# Patient Record
Sex: Female | Born: 1975 | Race: White | Hispanic: Yes | Marital: Married | Smoking: Never smoker
Health system: Southern US, Community
[De-identification: ages and names within clinical notes are randomized; demographics above are authoritative.]

## PROBLEM LIST (undated history)

## (undated) DIAGNOSIS — Z87440 Personal history of urinary (tract) infections: Secondary | ICD-10-CM

## (undated) DIAGNOSIS — R87629 Unspecified abnormal cytological findings in specimens from vagina: Secondary | ICD-10-CM

## (undated) DIAGNOSIS — Z8759 Personal history of other complications of pregnancy, childbirth and the puerperium: Secondary | ICD-10-CM

## (undated) DIAGNOSIS — D649 Anemia, unspecified: Secondary | ICD-10-CM

## (undated) DIAGNOSIS — Z8742 Personal history of other diseases of the female genital tract: Secondary | ICD-10-CM

## (undated) HISTORY — PX: CERVICAL BIOPSY: SHX590

## (undated) HISTORY — PX: OTHER SURGICAL HISTORY: SHX169

## (undated) HISTORY — DX: Unspecified abnormal cytological findings in specimens from vagina: R87.629

## (undated) HISTORY — DX: Personal history of other complications of pregnancy, childbirth and the puerperium: Z87.59

## (undated) HISTORY — PX: DILATION AND CURETTAGE OF UTERUS: SHX78

## (undated) HISTORY — DX: Personal history of other diseases of the female genital tract: Z87.42

## (undated) HISTORY — DX: Personal history of urinary (tract) infections: Z87.440

## (undated) HISTORY — DX: Anemia, unspecified: D64.9

---

## 2018-11-09 ENCOUNTER — Other Ambulatory Visit: Payer: Self-pay

## 2018-11-14 ENCOUNTER — Ambulatory Visit: Payer: Self-pay | Attending: Oncology | Admitting: *Deleted

## 2018-11-14 ENCOUNTER — Encounter: Payer: Self-pay | Admitting: *Deleted

## 2018-11-14 ENCOUNTER — Other Ambulatory Visit: Payer: Self-pay

## 2018-11-14 VITALS — BP 133/91 | HR 83 | Temp 97.7°F | Ht 64.0 in | Wt 201.4 lb

## 2018-11-14 DIAGNOSIS — N644 Mastodynia: Secondary | ICD-10-CM

## 2018-11-14 DIAGNOSIS — Z0000 Encounter for general adult medical examination without abnormal findings: Secondary | ICD-10-CM

## 2018-11-14 NOTE — Progress Notes (Signed)
  Subjective:     Patient ID: Genevieve Norlander, female   DOB: Dec 14, 1975, 43 y.o.   MRN: [id number]  HPI   Review of Systems     Objective:   Physical Exam Chest:     Breasts:        Right: Tenderness present. No swelling, bleeding, inverted nipple, mass, nipple discharge or skin change.        Left: Tenderness present. No swelling, bleeding, inverted nipple, mass, nipple discharge or skin change.    Abdominal:     Palpations: There is no hepatomegaly or splenomegaly.  Genitourinary:    Exam position: Lithotomy position.     Labia:        Right: No rash, tenderness, lesion or injury.        Left: No rash, tenderness, lesion or injury.      Urethra: No prolapse, urethral swelling or urethral lesion.     Vagina: No signs of injury and foreign body. No vaginal discharge, erythema, tenderness, bleeding, lesions or prolapsed vaginal walls.     Cervix: Friability present. No cervical motion tenderness, discharge, lesion, erythema, cervical bleeding or eversion.     Uterus: Not deviated, not enlarged, not fixed, not tender and no uterine prolapse.      Adnexa:        Right: No mass.         Left: No mass.       Rectum: No mass.     Lymphadenopathy:     Upper Body:     Right upper body: No supraclavicular or axillary adenopathy.     Left upper body: No supraclavicular or axillary adenopathy.        Assessment:     43 year old [place] female self referral presents with complaints of targeted left breast pain.  Leola Brazil, the interpreter present during the interview and exam.  States she has bilateral breast pain all the time with greater pain prior to her cycle.  States it is better with a support bra.  States they are very tender to palpation.  Drinks 1 cup of coffee most days. Points to the 4-5:00 area of the left breast and states this is the area that hurts the most and all the time.  On clinical breast exam bilateral breast are tender to palpation.  I can palpate  approximately 2, 1-2 cm nodules at 4-5:00 left breast.  Taught self breast awareness.  Patient states she had abnormal paps and had a conization in [place] in January 2020.  Without any notes or results to review, I felt it would be prudent to do a 6 month follow-up pap.  Specimen collected for pap without difficulty.  Patient has been screened for eligibility.  She does not have any insurance, Medicare or Medicaid.  She also meets financial eligibility.  Hand-out given on the Affordable Care Act. Risk Assessment    Risk Scores      11/14/2018   Last edited by: Orson Slick, CMA   5-year risk: 0.3 %   Lifetime risk: 4.7 %            Plan:     Will order bilateral diagnostic mammogram and ultrasound for targeted breast pain.  Jeanella Anton to schedule the patient.  Specimen for pap sent to the lab.  Will follow-up per [facility] protocol.

## 2018-11-21 ENCOUNTER — Ambulatory Visit
Admission: RE | Admit: 2018-11-21 | Discharge: 2018-11-21 | Disposition: A | Discharge status: Home / Self Care | Payer: Self-pay | Source: Ambulatory Visit | Attending: Oncology | Admitting: Oncology

## 2018-11-21 ENCOUNTER — Encounter: Payer: Self-pay | Admitting: Radiology

## 2018-11-21 DIAGNOSIS — Z0000 Encounter for general adult medical examination without abnormal findings: Secondary | ICD-10-CM

## 2018-11-23 LAB — PAP LB AND HPV HIGH-RISK: HPV, high-risk: NEGATIVE

## 2018-11-26 ENCOUNTER — Other Ambulatory Visit: Payer: Self-pay | Admitting: *Deleted

## 2018-11-26 DIAGNOSIS — N6489 Other specified disorders of breast: Secondary | ICD-10-CM

## 2018-11-29 ENCOUNTER — Encounter: Payer: Self-pay | Admitting: *Deleted

## 2018-11-29 NOTE — Progress Notes (Signed)
Called patient today via the interpreter Jacqui.  Reviewed mammogram and pap results.  Discussed that  Since I could palpate 2 nodules at the site of her pain at 5:00, and no mention of that area on the mammogram report, I would like to refer her for a surgical consult.  She is agreeable.  I have scheduled her to see Dr. Bary Castilla on 12/11/18 @ 9:45.  Will scheduled 6 month follow up mammogram as radiology recommended after her appointment.  Next pap will be due in 5 years.

## 2018-12-11 ENCOUNTER — Ambulatory Visit: Payer: Self-pay | Admitting: General Surgery

## 2018-12-12 ENCOUNTER — Ambulatory Visit (INDEPENDENT_AMBULATORY_CARE_PROVIDER_SITE_OTHER): Payer: Self-pay | Admitting: General Surgery

## 2018-12-12 ENCOUNTER — Encounter: Payer: Self-pay | Admitting: General Surgery

## 2018-12-12 ENCOUNTER — Other Ambulatory Visit: Payer: Self-pay

## 2018-12-12 VITALS — BP 133/87 | HR 79 | Temp 97.7°F | Resp 14 | Ht 65.0 in | Wt 206.0 lb

## 2018-12-12 DIAGNOSIS — N6002 Solitary cyst of left breast: Secondary | ICD-10-CM

## 2018-12-12 DIAGNOSIS — N644 Mastodynia: Secondary | ICD-10-CM

## 2018-12-12 NOTE — Progress Notes (Signed)
Patient ID: Ellan LambertAraceli Gutierrez, female   DOB: 04/13/1976, 43 y.o.   MRN: [id number]  Chief Complaint  Patient presents with  . New Patient (Initial Visit)    breast mass    HPI [place] Sharen HonesGutierrez is a 43 y.o. female.   Today's interview was held with the assistance of a Spanish language interpreter.  She has been referred for evaluation of a breast mass.  She states that she has had cysts in her breasts in the past, diagnosed in [place].  She recently underwent a mammogram and breast ultrasound due to pain in her left breast with masses palpated by her primary care provider.  There were no concerning findings on either imaging study.  Ms. Sharen HonesGutierrez states that she has noticed pain worse around the time of her menstrual cycles, however she is currently pregnant (approximately 5 to 6 weeks).  She reports menarche at the age of 43.  First pregnancy at [age] with 3 full-term pregnancies.  She did breast-feed her babies.  She has had exposure to 2 injectable birth control.  She has no known family history of breast cancer.  She denies any skin changes or nipple discharge.  She endorses doing monthly self exams and is able to palpate the small masses herself.  She is more concerned about the pain.  She states that the pain is better with a supportive bra.   No past medical history on file.  No past surgical history on file.  Family History  Problem Relation Age of Onset  . Diabetes Mother   . Uterine cancer Sister 4130  . Breast cancer Neg Hx     Social History Social History   Tobacco Use  . Smoking status: Never Smoker  . Smokeless tobacco: Never Used  Substance Use Topics  . Alcohol use: Never    Frequency: Never  . Drug use: Never    No Known Allergies  No current outpatient medications on file.   No current facility-administered medications for this visit.     Review of Systems Review of Systems  All other systems reviewed and are negative.   Blood pressure 133/87, pulse  79, temperature 97.7 F (36.5 C), resp. rate 14, height 5\' 5"  (1.651 m), weight 206 lb (93.4 kg), last menstrual period 10/18/2018, SpO2 99 %.  Physical Exam Physical Exam Constitutional:      General: She is not in acute distress.    Appearance: Normal appearance. She is obese.  HENT:     Head: Normocephalic and atraumatic.     Nose:     Comments: Covered with a mask secondary to COVID-19 precautions    Mouth/Throat:     Comments: Covered with a mask secondary to COVID-19 precautions Eyes:     General: No scleral icterus.       Right eye: No discharge.        Left eye: No discharge.     Conjunctiva/sclera: Conjunctivae normal.  Neck:     Musculoskeletal: Normal range of motion.     Comments: No thyromegaly or palpable thyroid masses. Cardiovascular:     Rate and Rhythm: Normal rate and regular rhythm.     Pulses: Normal pulses.  Pulmonary:     Effort: Pulmonary effort is normal.     Breath sounds: Normal breath sounds.  Chest:     Breasts:        Right: Normal.        Left: Tenderness present.       Comments: Within the  left breast, at approximately the 9 o'clock position, there is a tiny, well-circumscribed, mobile mass, approximately 2 mm.  This area is nontender.  At the 5 o'clock position just inferior to the nipple, the patient endorses tenderness to palpation.  There is a plaque of dense fibroglandular breast tissue in this location with a possible 0.5 cm mobile, rounded mass present. Abdominal:     General: Bowel sounds are normal.     Palpations: Abdomen is soft.  Genitourinary:    Comments: Deferred Musculoskeletal: Normal range of motion.        General: No swelling or tenderness.  Lymphadenopathy:     Cervical: No cervical adenopathy.     Upper Body:     Right upper body: No supraclavicular, axillary or pectoral adenopathy.     Left upper body: No supraclavicular, axillary or pectoral adenopathy.  Skin:    General: Skin is warm and dry.  Neurological:      General: No focal deficit present.     Mental Status: She is alert.  Psychiatric:        Mood and Affect: Mood normal.        Behavior: Behavior normal.        Thought Content: Thought content normal.        Judgment: Judgment normal.     Data Reviewed CLINICAL DATA:  Left breast upper outer quadrant area of pain felt by the patient for several months.  EXAM: DIGITAL DIAGNOSTIC BILATERAL MAMMOGRAM WITH CAD AND TOMO  ULTRASOUND LEFT BREAST  COMPARISON:  None available.  ACR Breast Density Category c: The breast tissue is heterogeneously dense, which may obscure small masses.  FINDINGS: Mammographically, there are no suspicious masses, areas of architectural distortion or microcalcifications in the right breast.  In the left breast, slightly lower inner quadrant, posterior depth, there is a bilobed focal asymmetry containing a single coarse benign-appearing calcification.  Mammographic images were processed with CAD.  On physical exam, no suspicious masses are palpated.  Targeted left breast ultrasound is performed, showing no suspicious masses or shadowing lesions in the area of pain in patient's left breast upper outer quadrant. No sonographic correlation is found to the mammographically seen bilobed focal asymmetry in the left breast slightly lower inner quadrant.  IMPRESSION: No mammographic evidence of malignancy in the right breast.  Probably benign left breast focal asymmetry, for which short-term follow-up is recommended.  RECOMMENDATION: Diagnostic mammogram and possibly ultrasound of the left breast in 6 months. (Code:DM-L-21M)  I have discussed the findings and recommendations with the patient. Results were also provided in writing at the conclusion of the visit. If applicable, a reminder letter will be sent to the patient regarding the next appointment.  BI-RADS CATEGORY  3: Probably benign.  I reviewed the mammogram as documented  above.  Assessment Based upon imaging findings and my own personal physical examination, I do not appreciate any concerning masses within the breasts.  The tiny lesion at the 9 o'clock position feels most consistent with a cyst, while the 5 o'clock position area feels like dense fibroglandular breast tissue with a possible small cyst in this location.  Plan I agree with the radiologist recommendations, that repeat imaging should be obtained in 6 months time.  I do not think she needs a biopsy or other surgical intervention.  If there are changes noted on the next imaging studies, she is welcome to return to our office to discuss additional steps.      Fredirick Maudlin 12/12/2018, 2:28  PM   

## 2018-12-12 NOTE — Patient Instructions (Addendum)
[facility] Dr Rubie Maid [phone]   Dolor a la palpacin de las mamas Breast Tenderness El dolor a la palpacin de las mamas es un problema frecuente en las mujeres de todas las edades. El dolor a la palpacin de las mamas puede causar molestias leves o dolor intenso. En general, el dolor es intermitente y se relaciona con el ciclo menstrual, pero puede ser [place]. [employer] causas posibles del dolor a la palpacin de las [place], incluidos los cambios hormonales y algunos medicamentos. El mdico podr solicitar estudios, como una mamografa o [place], para descartar otras causas del dolor. Por lo general, el dolor a la palpacin de las mamas no significa que usted Primary school teacher de mama. Siga estas indicaciones en su casa: A veces, tener la tranquilidad de que no padece cncer de mama es lo nico que se necesita. En general, deber seguir estas instrucciones para el cuidado en el hogar: Control del dolor y [place] molestias   Si se lo indican, aplique hielo sobre la zona: ? Field seismologist hielo en una bolsa plstica. ? Coloque una [employer] piel y la bolsa de hielo. ? Coloque el hielo durante 41minutos, de 2 a 3veces por da.  Use un sostn de soporte, especialmente mientras hace actividad fsica. Quizs tambin desee usar ese sostn para dormir, si siente las [employer]. Medicamentos  [employer] de venta libre y los recetados solamente como se lo haya indicado el mdico. Si el dolor es causado por alguna infeccin, posiblemente le receten un antibitico.  Si le recetaron un antibitico, tmelo como se lo haya indicado el mdico. No deje de tomar los antibiticos aunque comience a Sports administrator. Instrucciones generales   El mdico puede recomendarle consumir menos grasas. Puede hacer lo siguiente: ? Limite el consumo de alimentos fritos. ? A la hora de cocinarlos, opte por hornearlos, hervirlos, grillarlos y asarlos a Administrator, arts.   Disminuya la cantidad de cafena de su dieta. Puede lograrlo si bebe ms agua y opta por opciones sin cafena.  Lleva un registro de los das y las horas cuando tiene mayor sensibilidad en las [place].  Pregntele al mdico cmo debe hacerse los exmenes de mamas en su casa. Esto la ayudar a notar si tiene algn crecimiento o bulto fuera de lo normal. Comunquese con un mdico si:  Cualquier zona de la mama est dura, enrojecida y caliente al tacto. Puede ser un signo de infeccin.  Si no est en etapa de amamantamiento y [employer] lquido de los pezones, especialmente sangre o pus.  Tiene fiebre.  Tiene un bulto nuevo o doloroso en la mama que no desaparece despus de la finalizacin del perodo menstrual.  El dolor no mejora o [place].  El Airline pilot impide realizar las actividades cotidianas. Esta informacin no tiene Marine scientist el consejo del mdico. Asegrese de hacerle al mdico cualquier pregunta que tenga. Document Released: 02/20/2013 Document Revised: 10/27/2016 Document Reviewed: 09/22/2015 Elsevier Patient Education  2020 [employer].

## 2019-01-10 ENCOUNTER — Encounter: Payer: Self-pay | Admitting: *Deleted

## 2019-01-10 NOTE — Progress Notes (Signed)
Called and spoke to [place] in the breast center to discuss 6 month follow up recommendations since the patient is now pregnant per Dr. Glenford Peers notes.  The breast center has recommended that the patient return post delivery for follow up.  Left message for patient to return my call via language line interpreter Sabian [phone].  I would like for her to call me back after delivery to schedule her follow up appointment. Will send a letter to the interpreters for translation.

## 2019-01-23 ENCOUNTER — Encounter: Payer: Self-pay | Admitting: *Deleted

## 2019-01-23 NOTE — Progress Notes (Signed)
Letter mailed to inform patient of the need to return for a follow up mammogram after the delivery of her baby.  Patient is to call and I can schedule her mammo at that time.  Next pap due in 5 years.  HSIS to [place].

## 2019-11-12 ENCOUNTER — Other Ambulatory Visit: Payer: Self-pay

## 2019-11-12 ENCOUNTER — Ambulatory Visit (LOCAL_COMMUNITY_HEALTH_CENTER): Payer: Self-pay

## 2019-11-12 VITALS — BP 119/81 | Ht 65.0 in | Wt 209.5 lb

## 2019-11-12 DIAGNOSIS — Z3201 Encounter for pregnancy test, result positive: Secondary | ICD-10-CM

## 2019-11-12 LAB — PREGNANCY, URINE: Preg Test, Ur: POSITIVE — AB

## 2019-11-12 MED ORDER — PRENATAL VITAMIN 27-0.8 MG PO TABS: 1.0000 | ORAL_TABLET | Freq: Every day | ORAL | 0 refills | Status: DC

## 2019-11-12 NOTE — Progress Notes (Signed)
Covid vaccine declined today. Client counseled regarding availability of Covid vaccine clinic.No iimmunization record found in the [facility]. Jossie Ng, RN

## 2019-12-16 ENCOUNTER — Other Ambulatory Visit: Payer: Self-pay | Admitting: Advanced Practice Midwife

## 2019-12-16 ENCOUNTER — Ambulatory Visit: Payer: Medicaid Other | Admitting: Advanced Practice Midwife

## 2019-12-16 ENCOUNTER — Encounter: Payer: Self-pay | Admitting: Advanced Practice Midwife

## 2019-12-16 ENCOUNTER — Other Ambulatory Visit: Payer: Self-pay

## 2019-12-16 ENCOUNTER — Telehealth: Payer: Self-pay

## 2019-12-16 DIAGNOSIS — O09521 Supervision of elderly multigravida, first trimester: Secondary | ICD-10-CM

## 2019-12-16 DIAGNOSIS — O0990 Supervision of high risk pregnancy, unspecified, unspecified trimester: Secondary | ICD-10-CM | POA: Insufficient documentation

## 2019-12-16 DIAGNOSIS — O99211 Obesity complicating pregnancy, first trimester: Secondary | ICD-10-CM | POA: Diagnosis not present

## 2019-12-16 DIAGNOSIS — O0991 Supervision of high risk pregnancy, unspecified, first trimester: Secondary | ICD-10-CM | POA: Diagnosis not present

## 2019-12-16 DIAGNOSIS — O09529 Supervision of elderly multigravida, unspecified trimester: Secondary | ICD-10-CM | POA: Insufficient documentation

## 2019-12-16 DIAGNOSIS — Z550 Illiteracy and low-level literacy: Secondary | ICD-10-CM

## 2019-12-16 LAB — URINALYSIS
Bilirubin, UA: NEGATIVE
Glucose, UA: NEGATIVE
Ketones, UA: NEGATIVE
Nitrite, UA: NEGATIVE
Protein,UA: NEGATIVE
RBC, UA: NEGATIVE
Specific Gravity, UA: 1.015 (ref 1.005–1.030)
Urobilinogen, Ur: 0.2 mg/dL (ref 0.2–1.0)
pH, UA: 7 (ref 5.0–7.5)

## 2019-12-16 LAB — WET PREP FOR TRICH, YEAST, CLUE
Trichomonas Exam: NEGATIVE
Yeast Exam: NEGATIVE

## 2019-12-16 LAB — HEMOGLOBIN, FINGERSTICK: Hemoglobin: 11.8 g/dL (ref 11.1–15.9)

## 2019-12-16 NOTE — Telephone Encounter (Signed)
Phone call to patient to inform of [facility] MFM GC and [place] appt for 12/26/19 @ 10:00 and 11:00. No answer, LMTC with assistance of M. [place]. Tawny Hopping, RN

## 2019-12-16 NOTE — Progress Notes (Signed)
[place] results reviewed. Per standing orders no treatment indicated. Der Gagliano, RN  

## 2019-12-16 NOTE — Progress Notes (Signed)
[facility] HEALTH DEPT [facility] [address] [place] [address] [phone]  INITIAL PRENATAL VISIT NOTE  Subjective:  Ana Ali is a 44 y.o.MHF nonsmoker G5P3013 at [redacted]w[redacted]d being seen today to start prenatal care at the [facility] Department. She feels "happy" about planned pregnancy x 1 year.  44 yo employed husband of 24 years feels "happy" about pregnancy and is father of all of her children.  She is living with her husband and her 2 sons (22, 26 yo); her 26 yo daughter "walked away from me". LMP 09/24/19.  Denies cigs, MJ, last ETOH 4 years ago (3 beers).  Last pap 11/14/2018 neg HPV neg. She has a 6th grade education in [place] and is unemployed; in [place] since 07/2018.  States she has never had Covid infection and will think about getting a Covid vaccine.   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  Patient reports no complaints.   .  .   . Denies leaking of fluid.   Indications for ASA therapy (per uptodate) One of the following: Previous pregnancy with preeclampsia, especially early onset and with an adverse outcome No Multifetal gestation No Chronic hypertension No Type 1 or 2 diabetes mellitus No Chronic kidney disease No Autoimmune disease (antiphospholipid syndrome, systemic lupus erythematosus) No  Two or more of the following: Nulliparity No Obesity (body mass index >30 kg/m2) Yes Family history of preeclampsia in mother or sister No Age ?35 years Yes Sociodemographic characteristics (African American race, low  socioeconomic level) Yes Personal risk factors (eg, previous pregnancy with low birth weight or small for gestational age infant, previous adverse pregnancy outcome [eg, stillbirth], interval >10 years between pregnancies) Yes   The following portions of the patient's history were reviewed and updated as appropriate: allergies, current medications, past family history, past medical history, past social history, past surgical history and problem list. Problem list updated.  Objective:   Vitals:   12/16/19 0833  BP: 108/71  Pulse: 85  Temp: 98.1 F (36.7 C)  Weight: (!) 210 lb 6.4 oz (95.4 kg)    Fetal Status:            Physical Exam Vitals and nursing note reviewed.  Constitutional:      General: She is not in acute distress.    Appearance: Normal appearance. She is well-developed. She is obese.  HENT:     Head: Normocephalic and atraumatic.     Comments: Upper false teeth    Right Ear: External ear normal.     Left Ear: External ear normal.     Nose: Nose normal. No congestion or rhinorrhea.     Mouth/Throat:     Lips: Pink.     Mouth: Mucous membranes are moist.     Dentition: Normal dentition. No dental caries.     Pharynx: Oropharynx is clear. Uvula midline.  Eyes:     General: No scleral icterus.    Conjunctiva/sclera: Conjunctivae normal.  Neck:     Thyroid: No thyroid mass or thyromegaly.  Cardiovascular:     Rate and Rhythm: Normal rate.  Pulses: Normal pulses.     Comments: Extremities are warm and well perfused Pulmonary:     Effort: Pulmonary effort is normal.     Breath sounds: Normal breath sounds.  Chest:     Breasts: Breasts are symmetrical.        Right: Normal. No mass, nipple discharge or skin change.        Left: Normal. No mass, nipple discharge or skin change.  Abdominal:     Palpations: Abdomen is soft.     Tenderness: There is no abdominal tenderness.     Comments: Gravid, poor tone, soft, without tenderness  Genitourinary:     General: Normal vulva.     Exam position: Lithotomy position.     Pubic Area: No rash.      Labia:        Right: No rash.        Left: No rash.      Vagina: Normal. No vaginal discharge (creamy white leukorrhea, ph<4.5).     Cervix: Normal.     Uterus: Normal. Enlarged (Gravid 12 wk size). Not tender.      Adnexa: Right adnexa normal and left adnexa normal.     Rectum: Normal. No external hemorrhoid.  Musculoskeletal:     Right lower leg: No edema.     Left lower leg: No edema.  Lymphadenopathy:     Upper Body:     Right upper body: No axillary adenopathy.     Left upper body: No axillary adenopathy.  Skin:    General: Skin is warm.     Capillary Refill: Capillary refill takes less than 2 seconds.  Neurological:     Mental Status: She is alert.     Assessment and Plan:  Pregnancy: [id number] at [redacted]w[redacted]d  1. Maternal morbid obesity in first trimester, antepartum (HCC) 210 lbs with BMI=35.0 Pt agrees to take ASA 81 mg daily beginning tomorrow  2. Multigravida of advanced maternal age in first trimester Genetic counseling and FIRST screen ordered Needs early glucola Pt counseled on weight gain of 11-20 lbs this pregnancy - Lead, blood (adult age 1 yrs or greater) - Glucose, 1 hour gestational - Hgb A1c w/o eAG - Hemoglobinopathy evaluation -466599 - HIV Antibody (routine testing w rflx) - HCV Ab w/Rflx to Verification - Comprehensive metabolic panel - Prenatal Profile with Varicella(282020) - Protein / creatinine ratio, urine  (Spot) - TSH - Urine Culture - Chlamydia/GC NAA, Confirmation - QuantiFERON-TB Gold Plus - 357017 Drug Screen  3. Very low birth weight infant 01/20/94 term infant 3 lb in [place] Pt counseled to give [place] all 3 of her baby's birthweights from birth certificates (states they are in a "safe place" in [place] and she will call her sister to double check weights)  4. 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   Reviewed Centering pregnancy as standard of care at [facility], oriented to room and showed video. Based on EDD, plan for Cycle     Preterm labor symptoms and general obstetric precautions including but not limited to vaginal bleeding, contractions, leaking of fluid and fetal movement were reviewed in detail with the patient.  Please refer to After Visit Summary for other counseling recommendations.   Return in about 4 weeks (around 01/13/2020) for routine PNC.  No future appointments.  Alberteen Spindle, CNM

## 2019-12-16 NOTE — Progress Notes (Addendum)
Here today for 11.6 week MH IP. Taking PNV QD. Denies ED/hospital visits since +PT. Early 1hgtt today. Tawny Hopping, RN

## 2019-12-17 LAB — PROTEIN / CREATININE RATIO, URINE
Creatinine, Urine: 51.7 mg/dL
Protein, Ur: 6.8 mg/dL
Protein/Creat Ratio: 132 mg/g creat (ref 0–200)

## 2019-12-17 LAB — 789231 7+OXYCODONE-BUND
Amphetamines, Urine: NEGATIVE ng/mL
BENZODIAZ UR QL: NEGATIVE ng/mL
Barbiturate screen, urine: NEGATIVE ng/mL
Cannabinoid Quant, Ur: NEGATIVE ng/mL
Cocaine (Metab.): NEGATIVE ng/mL
OPIATE SCREEN URINE: NEGATIVE ng/mL
Oxycodone/Oxymorphone, Urine: NEGATIVE ng/mL
PCP Quant, Ur: NEGATIVE ng/mL

## 2019-12-17 LAB — LEAD, BLOOD (ADULT >= 16 YRS): Lead-Whole Blood: <1 ug/dL (ref 0–4)

## 2019-12-18 LAB — HGB FRACTIONATION CASCADE
Hgb A2: 2.8 % (ref 1.8–3.2)
Hgb A: 97.2 % (ref 96.4–98.8)
Hgb F: 0 % (ref 0.0–2.0)
Hgb S: 0 %

## 2019-12-18 LAB — CBC/D/PLT+RPR+RH+ABO+RUB AB...
Antibody Screen: NEGATIVE
Basophils Absolute: 0 10*3/uL (ref 0.0–0.2)
Basos: 1 %
EOS (ABSOLUTE): 0.1 10*3/uL (ref 0.0–0.4)
Eos: 1 %
Hematocrit: 35.5 % (ref 34.0–46.6)
Hemoglobin: 11.6 g/dL (ref 11.1–15.9)
Hepatitis B Surface Ag: NEGATIVE
Immature Grans (Abs): 0 10*3/uL (ref 0.0–0.1)
Immature Granulocytes: 0 %
Lymphocytes Absolute: 1.8 10*3/uL (ref 0.7–3.1)
Lymphs: 23 %
MCH: 30.3 pg (ref 26.6–33.0)
MCHC: 32.7 g/dL (ref 31.5–35.7)
MCV: 93 fL (ref 79–97)
Monocytes Absolute: 0.5 10*3/uL (ref 0.1–0.9)
Monocytes: 6 %
Neutrophils Absolute: 5.4 10*3/uL (ref 1.4–7.0)
Neutrophils: 69 %
Platelets: 303 10*3/uL (ref 150–450)
RBC: 3.83 x10E6/uL (ref 3.77–5.28)
RDW: 12.6 % (ref 11.7–15.4)
RPR Ser Ql: NONREACTIVE
Rh Factor: POSITIVE
Rubella Antibodies, IGG: 5.64 index (ref >0.99)
Varicella zoster IgG: 1163 index (ref >165)
WBC: 7.8 10*3/uL (ref 3.4–10.8)

## 2019-12-18 LAB — COMPREHENSIVE METABOLIC PANEL
ALT: 15 IU/L (ref 0–32)
AST: 19 IU/L (ref 0–40)
Albumin/Globulin Ratio: 1.6 (ref 1.2–2.2)
Albumin: 4.1 g/dL (ref 3.8–4.8)
Alkaline Phosphatase: 48 IU/L (ref 48–121)
BUN/Creatinine Ratio: 10 (ref 9–23)
BUN: 5 mg/dL — ABNORMAL LOW (ref 6–24)
Bilirubin Total: 0.3 mg/dL (ref 0.0–1.2)
CO2: 23 mmol/L (ref 20–29)
Calcium: 9.5 mg/dL (ref 8.7–10.2)
Chloride: 102 mmol/L (ref 96–106)
Creatinine, Ser: 0.52 mg/dL — ABNORMAL LOW (ref 0.57–1.00)
GFR calc Af Amer: 136 mL/min/{1.73_m2} (ref >59)
GFR calc non Af Amer: 118 mL/min/{1.73_m2} (ref >59)
Globulin, Total: 2.6 g/dL (ref 1.5–4.5)
Glucose: 95 mg/dL (ref 65–99)
Potassium: 4.4 mmol/L (ref 3.5–5.2)
Sodium: 135 mmol/L (ref 134–144)
Total Protein: 6.7 g/dL (ref 6.0–8.5)

## 2019-12-18 LAB — QUANTIFERON-TB GOLD PLUS
QuantiFERON Mitogen Value: >10 IU/mL
QuantiFERON Nil Value: 0 IU/mL
QuantiFERON TB1 Ag Value: 0.11 IU/mL
QuantiFERON TB2 Ag Value: 0.06 IU/mL
QuantiFERON-TB Gold Plus: NEGATIVE

## 2019-12-18 LAB — TSH: TSH: 1.5 u[IU]/mL (ref 0.450–4.500)

## 2019-12-18 LAB — CHLAMYDIA/GC NAA, CONFIRMATION
Chlamydia trachomatis, NAA: NEGATIVE
Neisseria gonorrhoeae, NAA: NEGATIVE

## 2019-12-18 LAB — HCV AB W/RFLX TO VERIFICATION: HCV Ab: <0.1 s/co ratio (ref 0.0–0.9)

## 2019-12-18 LAB — HGB A1C W/O EAG: Hgb A1c MFr Bld: 5.3 % (ref 4.8–5.6)

## 2019-12-18 LAB — GLUCOSE, 1 HOUR GESTATIONAL: Gestational Diabetes Screen: 77 mg/dL (ref 65–139)

## 2019-12-18 LAB — HCV INTERPRETATION

## 2019-12-18 LAB — HIV ANTIBODY (ROUTINE TESTING W REFLEX): HIV Screen 4th Generation wRfx: NONREACTIVE

## 2019-12-18 LAB — URINE CULTURE: Organism ID, Bacteria: NO GROWTH

## 2019-12-18 NOTE — Telephone Encounter (Signed)
Patient returned phone call. With assistance of M. [place], RN informed patient of scheduled [facility] MFM 12/26/19 GC and [place] appt. Instructions given to arrive at 9:45 for check in and registration via [facility] entrance. Tawny Hopping, RN

## 2019-12-26 ENCOUNTER — Other Ambulatory Visit: Payer: Self-pay

## 2019-12-26 ENCOUNTER — Ambulatory Visit: Payer: Self-pay | Attending: Advanced Practice Midwife

## 2019-12-26 ENCOUNTER — Ambulatory Visit (HOSPITAL_BASED_OUTPATIENT_CLINIC_OR_DEPARTMENT_OTHER): Payer: Self-pay

## 2019-12-26 DIAGNOSIS — Z3A13 13 weeks gestation of pregnancy: Secondary | ICD-10-CM

## 2019-12-26 DIAGNOSIS — O99211 Obesity complicating pregnancy, first trimester: Secondary | ICD-10-CM

## 2019-12-26 DIAGNOSIS — O09521 Supervision of elderly multigravida, first trimester: Secondary | ICD-10-CM

## 2019-12-26 NOTE — Progress Notes (Signed)
Referring Provider:  Alberteen Ali Length of Consultation: 40 minutes  [name] was referred to Maternal Fetal Care at [facility] for genetic counseling because of advanced maternal age.  The patient will be 44 years old at the time of delivery.  This note summarizes the information we discussed.    We explained that the chance of a chromosome abnormality increases with maternal age.  Chromosomes and examples of chromosome problems were reviewed.  Humans typically have 46 chromosomes in each cell, with half passed through each sperm and egg.  Any change in the number or structure of chromosomes can increase the risk of problems in the physical and mental development of a pregnancy.   Based upon age of the patient and the current gestational age, the chance of any chromosome abnormality was 1 in 54. The chance of Down syndrome, the most common chromosome problem associated with maternal age, was 1 in 85.  The risk of chromosome problems is in addition to the 3% general population risk for birth defects and intellectual disabilities.  The greatest chance, of course, is that the baby would be born in good health.  We discussed the following prenatal screening and testing options for this pregnancy:  Cell free fetal DNA testing analyzes maternal blood to determine whether or not the baby may have Down syndrome, trisomy 71, or trisomy 70.  This test utilizes a maternal blood sample and DNA sequencing technology to isolate circulating cell free fetal DNA from maternal plasma.  The fetal DNA can then be analyzed for DNA sequences that are derived from the three most common chromosomes involved in aneuploidy, chromosomes 13, 18, and 21.  If the overall amount of DNA is greater than the expected level for any of these chromosomes, aneuploidy is suspected.  While we do not consider it a replacement for invasive testing and karyotype analysis, a negative result from this testing would  be reassuring, though not a guarantee of a normal chromosome complement for the baby.  An abnormal result is certainly suggestive of an abnormal chromosome complement, though we would still recommend CVS or amniocentesis to confirm any findings from this testing.  First trimester screening, is another blood test which includes nuchal translucency ultrasound screen and first trimester maternal serum marker screening.  The nuchal translucency has approximately an 80% detection rate for Down syndrome and can be positive for other chromosome abnormalities as well as heart defects.  When combined with a maternal serum marker screening, the detection rate is up to 90% for Down syndrome and up to 97% for trisomy 18.     The chorionic villus sampling procedure is available for first trimester chromosome analysis.  This involves the withdrawal of a small amount of chorionic villi (tissue from the developing placenta).  Risk of pregnancy loss is estimated to be approximately 1 in 200 to 1 in 100 (0.5 to 1%).  There is approximately a 1% (1 in 100) chance that the CVS chromosome results will be unclear.  Chorionic villi cannot be tested for neural tube defects.     Maternal serum marker screening, a blood test that measures pregnancy proteins, can provide risk assessments for Down syndrome, trisomy 18, and open neural tube defects (spina bifida, anencephaly). Because it does not directly examine the fetus, it cannot positively diagnose or rule out these problems. The detection rate is approximately 75% for Down syndrome, 70% for trisomy 18 and 80% of open neural tube defects. If prior chromosome screening has  been performed, then AFP only is recommended to test for open neural tube defects alone.  Targeted ultrasound uses high frequency sound waves to create an image of the developing fetus.  An ultrasound is often recommended as a routine means of evaluating the pregnancy.  It is also used to screen for fetal anatomy  problems (for example, a heart defect) that might be suggestive of a chromosomal or other abnormality.   Amniocentesis involves the removal of a small amount of amniotic fluid from the sac surrounding the fetus with the use of a thin needle inserted through the maternal abdomen and uterus.  Ultrasound guidance is used throughout the procedure.  Fetal cells from amniotic fluid are directly evaluated and > 99.5% of chromosome problems and > 98% of open neural tube defects can be detected. This procedure is generally performed after the 15th week of pregnancy.  The main risks to this procedure include complications leading to miscarriage in less than 1 in 200 cases (0.5%).  Cystic Fibrosis and Spinal Muscular Atrophy (SMA) screening were also discussed with the patient. Both conditions are recessive, which means that both parents must be carriers in order to have a child with the disease.  Cystic fibrosis (CF) is one of the most common genetic conditions in persons of Caucasian ancestry.  This condition occurs in approximately 1 in 2,500 Caucasian persons and results in thickened secretions in the lungs, digestive, and reproductive systems.  For a baby to be at risk for having CF, both of the parents must be carriers for this condition.  Approximately 1 in 62 Caucasian persons is a carrier for CF.  Current carrier testing looks for the most common mutations in the gene for CF and can detect approximately 90% of carriers in the Caucasian population.  This means that the carrier screening can greatly reduce, but cannot eliminate, the chance for an individual to have a child with CF.  If an individual is found to be a carrier for CF, then carrier testing would be available for the partner. As part of [place] 's newborn screening profile, all babies born in the state of [place] will have a two-tier screening process.  Specimens are first tested to determine the concentration of immunoreactive trypsinogen  (IRT).  The top 5% of specimens with the highest IRT values then undergo DNA testing using a panel of over 40 common CF mutations. SMA is a neurodegenerative disorder that leads to atrophy of skeletal muscle and overall weakness.  This condition is also more prevalent in the Caucasian population, with 1 in 40-1 in 60 persons being a carrier and 1 in 6,000-1 in 10,000 children being affected.  There are multiple forms of the disease, with some causing death in infancy to other forms with survival into adulthood.  The genetics of SMA is complex, but carrier screening can detect up to 95% of carriers in the Caucasian population.  Similar to CF, a negative result can greatly reduce, but cannot eliminate, the chance to have a child with SMA.  Hemoglobinopathy screening was also offered to the patient. Screening for hemoglobinopathies was previously performed through [facility] and was normal (AA, MCV 93).  We obtained a detailed family history and pregnancy history.  The patient stated that this is the fifth pregnancy for she and her husband, [place].  He is 78 years old.  Due to the increased risk for anomalies associated with advanced paternal age, we would recommend a detailed anatomy ultrasound after [redacted] weeks gestation.  This  will be scheduled also due to maternal age.  They have three healthy children, ages 67,22 and 12 years.  They also had one early miscarriage.  She reported that she is one of 9 siblings, one of whom passed away at 71 years old due to uterine cancer.  There are no other family members with cancer at young ages or similar types of cancer to suggest a strong inherited component to this history. The remainder of the family history is unremarkable for birth defects, developmental delays, recurrent pregnancy loss or known chromosome abnormalities.  [name] reported no complications in the current pregnancy.  She reported no exposure to medications, tobacco, alcohol or recreational  drug use.  After consideration of the options, [name] [facility] elected to proceed with cell free fetal DNA testing and to decline carrier screening for CF and SMA.   An ultrasound was performed at the time of the visit.  The gestational age was consistent with [redacted] weeks gestation.  Fetal anatomy could not be assessed due to early gestational age.  Please refer to the ultrasound report for details of that study.  [name] was encouraged to call with questions or concerns.  We can be contacted at [phone].  Tests Ordered: MaterniT21 PLUS with [facility]   Cherly Anderson, MS, CGC

## 2019-12-30 ENCOUNTER — Encounter: Payer: Self-pay | Admitting: Advanced Practice Midwife

## 2019-12-31 LAB — MATERNIT21 PLUS CORE+SCA
Fetal Fraction: 9
Monosomy X (Turner Syndrome): NOT DETECTED
Result (T21): NEGATIVE
Trisomy 13 (Patau syndrome): NEGATIVE
Trisomy 18 (Edwards syndrome): NEGATIVE
Trisomy 21 (Down syndrome): NEGATIVE
XXX (Triple X Syndrome): NOT DETECTED
XXY (Klinefelter Syndrome): NOT DETECTED
XYY (Jacobs Syndrome): NOT DETECTED

## 2020-01-02 ENCOUNTER — Telehealth: Payer: Self-pay | Admitting: Obstetrics and Gynecology

## 2020-01-02 NOTE — Telephone Encounter (Signed)
The patient was informed of the results of her recent MaterniT21 testing which yielded NEGATIVE results.  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  The sensitivity for trisomy 21, trisomy 18 and trisomy 13 using this testing are reported as 99.1%, 99.9% and 91.7% respectively.  Thus, while the results of this testing are highly accurate, they are not considered diagnostic at this time.  Should more definitive information be desired, the patient may still consider amniocentesis.   As requested to know by the patient, sex chromosome analysis was included for this sample.  Results are consistent with a female fetus. This is predicted with >99% accuracy.  A maternal serum AFP only should be considered if screening for neural tube defects is desired.  We may be reached at [phone] with any questions or concerns.   Renesmay Nesbitt F. Brailee Riede, MS, CGC   

## 2020-01-13 ENCOUNTER — Other Ambulatory Visit: Payer: Self-pay

## 2020-01-13 ENCOUNTER — Encounter: Payer: Self-pay | Admitting: Family Medicine

## 2020-01-13 ENCOUNTER — Ambulatory Visit: Payer: Medicaid Other | Admitting: Family Medicine

## 2020-01-13 VITALS — BP 99/65 | HR 80 | Temp 98.3°F | Wt 213.4 lb

## 2020-01-13 DIAGNOSIS — O09521 Supervision of elderly multigravida, first trimester: Secondary | ICD-10-CM

## 2020-01-13 DIAGNOSIS — O99211 Obesity complicating pregnancy, first trimester: Secondary | ICD-10-CM | POA: Diagnosis not present

## 2020-01-13 DIAGNOSIS — O0991 Supervision of high risk pregnancy, unspecified, first trimester: Secondary | ICD-10-CM | POA: Diagnosis not present

## 2020-01-13 NOTE — Progress Notes (Signed)
Patient here for MH RV at 15 6/7 weeks. AFP only today. Patient brought photo (from birth records) of birth weights for her 3 children, chart updated.Burt Knack, RN

## 2020-01-13 NOTE — Progress Notes (Signed)
   PRENATAL VISIT NOTE  Subjective:  Ana Ali is a 44 y.o. [phone] at [redacted]w[redacted]d being seen today for ongoing prenatal care.  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  Patient reports no complaints.  Contractions: Not present. Vag. Bleeding: None.  Movement: Absent. Denies leaking of fluid/ROM.   The following portions of the patient's history were reviewed and updated as appropriate: allergies, current medications, past family history, past medical history, past social history, past surgical history and problem list. Problem list updated.  Objective:   Vitals:   01/13/20 1028  BP: 99/65  Pulse: 80  Temp: 98.3 F (36.8 C)  Weight: 213 lb 6.4 oz (96.8 kg)    Fetal Status: Fetal Heart Rate (bpm): 160 Fundal Height: 15 cm Movement: Absent     General:  Alert, oriented and cooperative. Patient is in no acute distress.  Skin: Skin is warm and dry. No rash noted.   Cardiovascular: Normal heart rate noted  Respiratory: Normal respiratory effort, no problems with respiration noted  Abdomen: Soft, gravid, appropriate for gestational age.  Pain/Pressure: Absent     Pelvic: Cervical exam deferred        Extremities: Normal range of motion.  Edema: None  Mental Status: Normal mood and affect. Normal behavior. Normal judgment and thought content.   Assessment and Plan:  Pregnancy: [id number] at [redacted]w[redacted]d    1. Supervision of high risk pregnancy in first trimester -Up to date. Detailed anatomy US already scheduled for Sept 23 at [facility]. -Reviewed [place] from 8/12 - wnl -Children birth weights  updated in chart - 2.9 kg (1995), 4.3kg (1999), 3.1kg (2008). - AFP Only [facility]- Scanned result  2. Multigravida of advanced maternal age in first trimester -NIPT negative results. Getting AFP today. Has detailed anatomy US scheduled - AFP Only [facility]- Scanned result  3. Maternal morbid obesity in first trimester, antepartum (HCC) 210 lbs with BMI=35.0 23 lb 6.4 oz (10.6 kg) Encouraged walking 20 min daily, swimming if access to pool    Preterm labor symptoms and general obstetric precautions including but not limited to vaginal bleeding, contractions, leaking of fluid and fetal movement were reviewed in detail with the patient. Please refer to After Visit Summary for other counseling recommendations.  Return in about 4 weeks (around 02/10/2020) for routine prenatal care.  Future Appointments  Date Time Provider Department Center  02/06/2020  9:00 AM [facility]    Ann Held, PA-C

## 2020-02-03 ENCOUNTER — Other Ambulatory Visit: Payer: Self-pay | Admitting: Advanced Practice Midwife

## 2020-02-03 DIAGNOSIS — O09519 Supervision of elderly primigravida, unspecified trimester: Secondary | ICD-10-CM

## 2020-02-03 DIAGNOSIS — O09522 Supervision of elderly multigravida, second trimester: Secondary | ICD-10-CM

## 2020-02-05 ENCOUNTER — Telehealth: Payer: Self-pay | Admitting: Student

## 2020-02-05 NOTE — Telephone Encounter (Signed)
TC from patient, requesting reminder for appointment time for MFM appointment. Patient made aware of MFM [place] on 9/23 at 9am. Sharlyne Pacas, RN

## 2020-02-06 ENCOUNTER — Encounter: Payer: Self-pay | Admitting: Advanced Practice Midwife

## 2020-02-06 ENCOUNTER — Other Ambulatory Visit: Payer: Self-pay

## 2020-02-06 ENCOUNTER — Ambulatory Visit: Payer: Self-pay | Attending: Maternal & Fetal Medicine

## 2020-02-06 DIAGNOSIS — O09512 Supervision of elderly primigravida, second trimester: Secondary | ICD-10-CM | POA: Insufficient documentation

## 2020-02-06 DIAGNOSIS — O09522 Supervision of elderly multigravida, second trimester: Secondary | ICD-10-CM | POA: Insufficient documentation

## 2020-02-06 DIAGNOSIS — Z3A20 20 weeks gestation of pregnancy: Secondary | ICD-10-CM | POA: Insufficient documentation

## 2020-02-06 DIAGNOSIS — O99212 Obesity complicating pregnancy, second trimester: Secondary | ICD-10-CM | POA: Insufficient documentation

## 2020-02-06 DIAGNOSIS — O09519 Supervision of elderly primigravida, unspecified trimester: Secondary | ICD-10-CM

## 2020-02-06 DIAGNOSIS — Z3A19 19 weeks gestation of pregnancy: Secondary | ICD-10-CM

## 2020-02-06 DIAGNOSIS — E669 Obesity, unspecified: Secondary | ICD-10-CM | POA: Insufficient documentation

## 2020-02-06 NOTE — Addendum Note (Signed)
Addended by: Heywood Bene on: 02/06/2020 02:32 PM   Modules accepted: Orders

## 2020-02-07 ENCOUNTER — Encounter: Payer: Self-pay | Admitting: Family Medicine

## 2020-02-10 ENCOUNTER — Other Ambulatory Visit: Payer: Self-pay

## 2020-02-10 ENCOUNTER — Encounter: Payer: Self-pay | Admitting: Family Medicine

## 2020-02-10 ENCOUNTER — Ambulatory Visit: Payer: Medicaid Other | Admitting: Family Medicine

## 2020-02-10 VITALS — BP 104/71 | HR 87 | Temp 98.0°F | Wt 214.4 lb

## 2020-02-10 DIAGNOSIS — O0991 Supervision of high risk pregnancy, unspecified, first trimester: Secondary | ICD-10-CM

## 2020-02-10 DIAGNOSIS — O09522 Supervision of elderly multigravida, second trimester: Secondary | ICD-10-CM

## 2020-02-10 DIAGNOSIS — O99212 Obesity complicating pregnancy, second trimester: Secondary | ICD-10-CM | POA: Diagnosis not present

## 2020-02-10 DIAGNOSIS — O99211 Obesity complicating pregnancy, first trimester: Secondary | ICD-10-CM

## 2020-02-10 DIAGNOSIS — O0992 Supervision of high risk pregnancy, unspecified, second trimester: Secondary | ICD-10-CM

## 2020-02-10 DIAGNOSIS — O09521 Supervision of elderly multigravida, first trimester: Secondary | ICD-10-CM

## 2020-02-10 MED ORDER — PRENATAL VITAMINS 28-0.8 MG PO TABS: 1.0000 | ORAL_TABLET | Freq: Every day | ORAL | 1 refills | Status: DC

## 2020-02-10 NOTE — Progress Notes (Signed)
   PRENATAL VISIT NOTE  Subjective:  Ana Ali is a 44 y.o. [phone] at [redacted]w[redacted]d being seen today for ongoing prenatal care.  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  Patient reports no complaints.  Contractions: Not present. Vag. Bleeding: None.  Movement: Present. Denies leaking of fluid/ROM.   The following portions of the patient's history were reviewed and updated as appropriate: allergies, current medications, past family history, past medical history, past social history, past surgical history and problem list. Problem list updated.  Objective:   Vitals:   02/10/20 0829  BP: 104/71  Pulse: 87  Temp: 98 F (36.7 C)  Weight: 214 lb 6.4 oz (97.3 kg)    Fetal Status: Fetal Heart Rate (bpm): 160 Fundal Height: 20 cm Movement: Present     General:  Alert, oriented and cooperative. Patient is in no acute distress.  Skin: Skin is warm and dry. No rash noted.   Cardiovascular: Normal heart rate noted  Respiratory: Normal respiratory effort, no problems with respiration noted  Abdomen: Soft, gravid, appropriate for gestational age.  Pain/Pressure: Absent     Pelvic: Cervical exam deferred        Extremities: Normal range of motion.  Edema: None  Mental Status: Normal mood and affect. Normal behavior. Normal judgment and thought content.   Assessment and Plan:  Pregnancy: [id number] at [redacted]w[redacted]d    1. Supervision of high risk pregnancy in first trimester -Up to date.  -Pt walked to [facility] office for questions regarding presumptive Medicaid  2. Multigravida of advanced maternal age in first trimester -Reviewed detailed anatom y [place] 9/23 = WNL.  NIPT and AFP negative.   3. Maternal morbid obesity in first trimester, antepartum (HCC) 210 lbs with BMI=35.0 24 lb 6.4 oz (11.1 kg)    Preterm labor symptoms and general obstetric precautions including but not limited to vaginal bleeding, contractions, leaking of fluid and fetal movement were reviewed in detail with the patient. Please refer to After Visit Summary for other counseling recommendations.  Return in about 4 weeks (around 03/09/2020) for routine prenatal care.  Future Appointments  Date Time Provider Department Center  04/16/2020  9:00 AM [facility]    Ann Held, PA-C

## 2020-02-10 NOTE — Progress Notes (Signed)
Presents for routine MH RV. Takes PNV, given more today. Denies ED/Hospital visit since last RV. Given Peds list and BCM graphic. Sharlyne Pacas, RN

## 2020-03-09 ENCOUNTER — Other Ambulatory Visit: Payer: Self-pay

## 2020-03-09 ENCOUNTER — Ambulatory Visit: Payer: Self-pay | Admitting: Advanced Practice Midwife

## 2020-03-09 ENCOUNTER — Ambulatory Visit: Payer: Self-pay

## 2020-03-09 VITALS — BP 97/63 | HR 82 | Temp 98.8°F | Wt 216.4 lb

## 2020-03-09 DIAGNOSIS — O09521 Supervision of elderly multigravida, first trimester: Secondary | ICD-10-CM

## 2020-03-09 DIAGNOSIS — O99211 Obesity complicating pregnancy, first trimester: Secondary | ICD-10-CM

## 2020-03-09 DIAGNOSIS — O0991 Supervision of high risk pregnancy, unspecified, first trimester: Secondary | ICD-10-CM

## 2020-03-09 NOTE — Progress Notes (Signed)
Remains undecided as to pediatricain - has Pharmacist, hospital. .Jossie Ng, RN

## 2020-03-09 NOTE — Progress Notes (Signed)
° °  PRENATAL VISIT NOTE  Subjective:  Ana Ali is a 44 y.o. [phone] at [redacted]w[redacted]d being seen today for ongoing prenatal care.  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  Patient reports no complaints.  Contractions: Not present. Vag. Bleeding: None.  Movement: Present. Denies leaking of fluid/ROM.   The following portions of the patient's history were reviewed and updated as appropriate: allergies, current medications, past family history, past medical history, past social history, past surgical history and problem list. Problem list updated.  Objective:   Vitals:   03/09/20 0837  BP: 97/63  Pulse: 82  Temp: 98.8 F (37.1 C)  Weight: 216 lb 6.4 oz (98.2 kg)    Fetal Status: Fetal Heart Rate (bpm): 160 Fundal Height: 25 cm Movement: Present     General:  Alert, oriented and cooperative. Patient is in no acute distress.  Skin: Skin is warm and dry. No rash noted.   Cardiovascular: Normal heart rate noted  Respiratory: Normal respiratory effort, no problems with respiration noted  Abdomen: Soft, gravid, appropriate for gestational age.  Pain/Pressure: Absent     Pelvic: Cervical exam deferred        Extremities: Normal range of motion.  Edema: None  Mental Status: Normal mood and affect. Normal behavior. Normal judgment and thought content.   Assessment and Plan:  Pregnancy: [id number] at [redacted]w[redacted]d  1. Supervision of high risk pregnancy in first trimester Not working.  Here with 22 yo daughter.  Taking ASA 81 mg daily C/o decreased appetite since discovered pregnancy.  Counseled needs to gain 11-20 lbs this preg.  26 lb 6.4 oz (12 kg). Encouraged high  protein snacks frequently throughout day.  Not exercising because had an SAB and is worried--reassured pt and encouraged exercise 3x/wk x 20 min.   Living with husband and 3 kids. Declines Covid vaccine but desires flu shot    Preterm labor symptoms and general obstetric precautions including but not limited to vaginal bleeding, contractions, leaking of fluid and fetal movement were reviewed in detail with the patient. Please refer to After Visit Summary for other counseling recommendations.  No follow-ups on file.  Future Appointments  Date Time Provider Department Center  04/16/2020  9:00 AM [facility]    Alberteen Spindle, CNM

## 2020-03-09 NOTE — Progress Notes (Signed)
Flu vaccine given and tolerated well. Tawny Hopping, RN

## 2020-04-06 ENCOUNTER — Ambulatory Visit: Payer: Self-pay | Admitting: Family Medicine

## 2020-04-06 ENCOUNTER — Other Ambulatory Visit: Payer: Self-pay

## 2020-04-06 VITALS — BP 109/74 | HR 94 | Temp 98.2°F | Wt 221.2 lb

## 2020-04-06 DIAGNOSIS — O0991 Supervision of high risk pregnancy, unspecified, first trimester: Secondary | ICD-10-CM

## 2020-04-06 DIAGNOSIS — O0992 Supervision of high risk pregnancy, unspecified, second trimester: Secondary | ICD-10-CM

## 2020-04-06 DIAGNOSIS — Z23 Encounter for immunization: Secondary | ICD-10-CM

## 2020-04-06 DIAGNOSIS — O99212 Obesity complicating pregnancy, second trimester: Secondary | ICD-10-CM

## 2020-04-06 DIAGNOSIS — O09522 Supervision of elderly multigravida, second trimester: Secondary | ICD-10-CM

## 2020-04-06 LAB — HEMOGLOBIN, FINGERSTICK: Hemoglobin: 11.8 g/dL (ref 11.1–15.9)

## 2020-04-06 NOTE — Progress Notes (Addendum)
PRENATAL VISIT NOTE  Subjective:  Ana Ali is a 44 y.o. [phone] at [redacted]w[redacted]d being seen today for ongoing prenatal care.  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  Patient reports heartburn.  Contractions: Not present. Vag. Bleeding: None.  Movement: Present. Denies leaking of fluid/ROM.   The following portions of the patient's history were reviewed and updated as appropriate: allergies, current medications, past family history, past medical history, past social history, past surgical history and problem list. Problem list updated.  Objective:   Vitals:   04/06/20 1557  BP: 109/74  Pulse: 94  Temp: 98.2 F (36.8 C)  Weight: 221 lb 3.2 oz (100.3 kg)    Fetal Status: Fetal Heart Rate (bpm): 157 Fundal Height: 28 cm Movement: Present     General:  Alert, oriented and cooperative. Patient is in no acute distress.  Skin: Skin is warm and dry. No rash noted.   Cardiovascular: Normal heart rate noted  Respiratory: Normal respiratory effort, no problems with respiration noted  Abdomen: Soft, gravid, appropriate for gestational age.  Pain/Pressure: Absent     Pelvic: Cervical exam deferred        Extremities: Normal range of motion.  Edema: None  Mental Status: Normal mood and affect. Normal behavior. Normal judgment and thought content.   Assessment and Plan:  Pregnancy: [id number] at [redacted]w[redacted]d  1. Multigravida of advanced maternal age in second trimester  - Glucose, 1 hour gestational - HIV Antibody (routine testing w rflx) - Hemoglobin, fingerstick - RPR   -28 wk labs ordered today.   Awaiting results   -Reviewed prenatal visit schedule q2 wks until 36 wks, then q1 wk.   Patient discontinued PNA and ASA d/t heartburn. Discussed with patient the importance of PNV and ASA, during pregnancy.  Recommended not taking medications together.  Also taking medications with food.  "heartburn during pregnancy" education sheet given, also "medications to take while pregnant" sheet given with emphasis on heartburn.      2. Maternal morbid obesity in first trimester, antepartum (HCC) 210 lbs with BMI=35.0  Patient wt. Gain total is 31 lbs.  Discussed recommended wt gain (11-20 lbs), healthy diet, and physical activity in pregnancy.  Diet recall completed with patient.  Patient is eating 3 large meals because she is "hungry"   Discussed about eating more small meals.  Pt agreed to  MNT referral.  Referral sent today.   Pt to continue with  81mg  aspirin daily from 12-[redacted] wks gestation. Handout with instructions given.    3. Need for diphtheria-tetanus-pertussis (Tdap) vaccine  Tdap vaccine  to be given today- see RN documentation    Preterm labor symptoms and general obstetric precautions including but not limited to vaginal bleeding, contractions, leaking of fluid and fetal movement were reviewed in detail with the patient. Please refer to After Visit Summary for other counseling recommendations.  Return in about 2 weeks (around 04/20/2020) for routine prenatal care.  Future Appointments  Date Time Provider Department Center  04/16/2020  9:00 AM [facility]  04/20/2020  3:30 PM [facility] PROVIDER [facility] None    14/10/2019, FNP  Attestation- Collaborating Physician for APP practice:  Reviewed documentation. I was available for consult as needed. I refreshed the template to assure the FHR and pregnancy ROS came into the the provider note.   Lyndel Safe MD MPH [facility] Medical Director

## 2020-04-06 NOTE — Progress Notes (Signed)
Presents for MH RV at 27.6weeks. Has not taken PNV or ASA for 12 days due to acid reflux. Provider aware. Denies ED/Hospital visit. 28 week labs collected. Tdap given today, tolerated well. Sharlyne Pacas, RN

## 2020-04-07 LAB — RPR: RPR Ser Ql: NONREACTIVE

## 2020-04-07 LAB — GLUCOSE, 1 HOUR GESTATIONAL: Gestational Diabetes Screen: 88 mg/dL (ref 65–139)

## 2020-04-07 LAB — HIV ANTIBODY (ROUTINE TESTING W REFLEX): HIV Screen 4th Generation wRfx: NONREACTIVE

## 2020-04-13 ENCOUNTER — Other Ambulatory Visit: Payer: Self-pay | Admitting: Maternal & Fetal Medicine

## 2020-04-13 DIAGNOSIS — O09529 Supervision of elderly multigravida, unspecified trimester: Secondary | ICD-10-CM

## 2020-04-13 DIAGNOSIS — O99210 Obesity complicating pregnancy, unspecified trimester: Secondary | ICD-10-CM

## 2020-04-14 ENCOUNTER — Telehealth: Payer: Self-pay | Admitting: Dietician

## 2020-04-14 ENCOUNTER — Ambulatory Visit: Payer: Self-pay | Admitting: Dietician

## 2020-04-14 NOTE — Progress Notes (Signed)
Assessment: Ht: 63 7/8 Wt: 215 Meds: Aspirin, PNV Dx: Morbid obesity  Living Conditions:  Ability to cook Language:  Spanish Time:   10:30 AM    Duration:  45 minutes   Spoke to patient today regarding morbid obesity in pregnancy. She reports that she thinks her weight gain is due to her pregnancy. She currently exercises 7 days a week. She follows along with youtube exercises for pregnant women and also walks twice a week. She is monitoring her portions and attempting to eat heathy. I praised her for all of her efforts and encouraged her to keep it up.   24 Hour Recall/Diet Hx Breakfast:  8:00 AM fried banana with milk and cinnamon Lunch:  2:00 PM 1 egg with water Dinner:  8:00 PM pupusas (beans and cheese) with water Snack:  Mandarin oranges  Diagnosis: Morbid obesity related to pregnancy as evidenced by a 20 pound weight gain in the past 5 months.  Intervention:  1) Continue to monitor portion sizes 2) Continue exercises daily 3) Increase fruits/veggies daily 4) Continue choosing only water  Monitoring/Evaluation:  Follow up in a couple of months  Marcha Dutton RDN, LDN

## 2020-04-16 ENCOUNTER — Other Ambulatory Visit: Payer: Self-pay

## 2020-04-16 ENCOUNTER — Ambulatory Visit: Payer: Self-pay | Attending: Maternal & Fetal Medicine

## 2020-04-16 DIAGNOSIS — O99213 Obesity complicating pregnancy, third trimester: Secondary | ICD-10-CM | POA: Insufficient documentation

## 2020-04-16 DIAGNOSIS — O09523 Supervision of elderly multigravida, third trimester: Secondary | ICD-10-CM | POA: Insufficient documentation

## 2020-04-16 DIAGNOSIS — O99210 Obesity complicating pregnancy, unspecified trimester: Secondary | ICD-10-CM

## 2020-04-16 DIAGNOSIS — O09529 Supervision of elderly multigravida, unspecified trimester: Secondary | ICD-10-CM

## 2020-04-16 DIAGNOSIS — Z3A29 29 weeks gestation of pregnancy: Secondary | ICD-10-CM | POA: Insufficient documentation

## 2020-04-20 ENCOUNTER — Ambulatory Visit: Payer: Self-pay

## 2020-04-22 ENCOUNTER — Ambulatory Visit: Payer: Self-pay | Admitting: Advanced Practice Midwife

## 2020-04-22 ENCOUNTER — Other Ambulatory Visit: Payer: Self-pay

## 2020-04-22 DIAGNOSIS — O99211 Obesity complicating pregnancy, first trimester: Secondary | ICD-10-CM

## 2020-04-22 DIAGNOSIS — O0991 Supervision of high risk pregnancy, unspecified, first trimester: Secondary | ICD-10-CM

## 2020-04-22 NOTE — Progress Notes (Signed)
   PRENATAL VISIT NOTE  Subjective:  Ana Ali is a 44 y.o. [phone] at [redacted]w[redacted]d being seen today for ongoing prenatal care.  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  Patient reports no complaints.  Contractions: Not present. Vag. Bleeding: None.  Movement: Present. Denies leaking of fluid/ROM.   The following portions of the patient's history were reviewed and updated as appropriate: allergies, current medications, past family history, past medical history, past social history, past surgical history and problem list. Problem list updated.  Objective:   Vitals:   04/22/20 1445  BP: 105/73  Pulse: 84  Temp: 98.2 F (36.8 C)  Weight: 221 lb 3.2 oz (100.3 kg)    Fetal Status:   Fundal Height: 32 cm Movement: Present     General:  Alert, oriented and cooperative. Patient is in no acute distress.  Skin: Skin is warm and dry. No rash noted.   Cardiovascular: Normal heart rate noted  Respiratory: Normal respiratory effort, no problems with respiration noted  Abdomen: Soft, gravid, appropriate for gestational age.  Pain/Pressure: Absent     Pelvic: Cervical exam deferred        Extremities: Normal range of motion.  Edema: None  Mental Status: Normal mood and affect. Normal behavior. Normal judgment and thought content.   Assessment and Plan:  Pregnancy: [id number] at [redacted]w[redacted]d  1. Supervision of high risk pregnancy in first trimester Feels well.  1 hour glucola on 04/06/20=88.    2. Maternal morbid obesity in first trimester, antepartum (HCC) 210 lbs with BMI=35.0 31 lb 3.2 oz (14.2 kg) Taking ASA 81 mg  daily MNT consult done via phone on 04/14/20 Not working.  Walking 3x/wk x 40 min   Preterm labor symptoms and general obstetric precautions including but not limited to vaginal bleeding, contractions, leaking of fluid and fetal movement were reviewed in detail with the patient. Please refer to After Visit Summary for other counseling recommendations.  Return in about 2 weeks (around 05/06/2020) for routine PNC.  Future Appointments  Date Time Provider Department Center  05/06/2020  3:00 PM [facility] PROVIDER [facility] None  05/14/2020  3:00 PM [facility]    Alberteen Spindle, CNM

## 2020-05-06 ENCOUNTER — Encounter: Payer: Self-pay | Admitting: Physician Assistant

## 2020-05-06 ENCOUNTER — Other Ambulatory Visit: Payer: Self-pay

## 2020-05-06 ENCOUNTER — Ambulatory Visit: Payer: Self-pay | Admitting: Physician Assistant

## 2020-05-06 VITALS — BP 104/73 | HR 94 | Temp 98.0°F | Wt 221.8 lb

## 2020-05-06 DIAGNOSIS — O99210 Obesity complicating pregnancy, unspecified trimester: Secondary | ICD-10-CM

## 2020-05-06 DIAGNOSIS — O0990 Supervision of high risk pregnancy, unspecified, unspecified trimester: Secondary | ICD-10-CM

## 2020-05-06 NOTE — Progress Notes (Signed)
   PRENATAL VISIT NOTE  Subjective:  Ana Ali is a 44 y.o. [phone] at [redacted]w[redacted]d being seen today for ongoing prenatal care.  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  Patient reports weakess, and asks if she needs extra vitamins. Reviewed diet: awakes at 6am to prepare lunch for family but does not eat herself til 10am. Feels weak. Eats plantains and beans. Has lunch of soup or egg/avocado/beans/rice at 1pm, then dinner at 9pm of beans/soup/grits with cheese or pupusas. Drinks mostly water. Contractions: Not present. Vag. Bleeding: None.  Movement: Present. Denies leaking of fluid/ROM.   The following portions of the patient's history were reviewed and updated as appropriate: allergies, current medications, past family history, past medical history, past social history, past surgical history and problem list. Problem list updated.  Objective:   Vitals:   05/06/20 1453  BP: 104/73  Pulse: 94  Temp: 98 F (36.7 C)  Weight: 221 lb 12.8 oz (100.6 kg)    Fetal Status: Fetal Heart Rate (bpm): 152 Fundal Height: 33 cm Movement: Present     General:  Alert, oriented and cooperative. Patient is in no acute distress.  Skin: Skin is warm and dry. No rash noted.   Cardiovascular: Normal heart rate noted  Respiratory: Normal respiratory effort, no problems with respiration noted  Abdomen: Soft, gravid, appropriate for gestational age.  Pain/Pressure: Absent     Pelvic: Cervical exam deferred        Extremities: Normal range of motion.  Edema: None  Mental Status: Normal mood and affect. Normal behavior. Normal judgment and  thought content.   Assessment and Plan:  Pregnancy: [id number] at [redacted]w[redacted]d  1. Supervision of high risk pregnancy, antepartum Encouraged to add small protein-rich meal within 1 hour of awakening to see if that improves morning weakness. Enc to keep growth [place] as sched next week.  2. Maternal morbid obesity, antepartum (HCC) 32-pound wt gain so far this pregnancy, excessive. Encourage physical activity. Shift in caloric intake to earlier in day may improve metabolism.   Preterm labor symptoms and general obstetric precautions including but not limited to vaginal bleeding, contractions, leaking of fluid and fetal movement were reviewed in detail with the patient. Please refer to After Visit Summary for other counseling recommendations.  Return in about 2 weeks (around 05/20/2020) for Routine prenatal care.  Future Appointments  Date Time Provider Department Center  05/14/2020  3:00 PM [facility]  05/20/2020  3:00 PM [facility] PROVIDER [facility] None    Landry Dyke, PA-C

## 2020-05-11 ENCOUNTER — Other Ambulatory Visit: Payer: Self-pay | Admitting: Advanced Practice Midwife

## 2020-05-11 DIAGNOSIS — O09523 Supervision of elderly multigravida, third trimester: Secondary | ICD-10-CM

## 2020-05-11 DIAGNOSIS — O99213 Obesity complicating pregnancy, third trimester: Secondary | ICD-10-CM

## 2020-05-14 ENCOUNTER — Other Ambulatory Visit: Payer: Self-pay

## 2020-05-14 ENCOUNTER — Ambulatory Visit: Payer: Self-pay | Attending: Maternal & Fetal Medicine

## 2020-05-14 DIAGNOSIS — O99213 Obesity complicating pregnancy, third trimester: Secondary | ICD-10-CM | POA: Insufficient documentation

## 2020-05-14 DIAGNOSIS — E669 Obesity, unspecified: Secondary | ICD-10-CM | POA: Insufficient documentation

## 2020-05-14 DIAGNOSIS — O0990 Supervision of high risk pregnancy, unspecified, unspecified trimester: Secondary | ICD-10-CM

## 2020-05-14 DIAGNOSIS — O09523 Supervision of elderly multigravida, third trimester: Secondary | ICD-10-CM

## 2020-05-14 DIAGNOSIS — Z3A33 33 weeks gestation of pregnancy: Secondary | ICD-10-CM

## 2020-05-16 NOTE — L&D Delivery Note (Signed)
Delivery Note  First Stage: Labor onset: 0800 Augmentation: AROM Analgesia /Anesthesia intrapartum: epidural AROM at 06/21/20 at 0321  Second Stage: Complete dilation at 0323 Onset of pushing at 0331 FHR second stage Cat II Variable decels  Delivery of a viable female infant on 06/21/20 at 0339 by CNM delivery of fetal head in OA position with restitution to ROA. Loose nuchal cord x 1;  Anterior then posterior shoulders delivered easily with gentle downward traction. Baby placed on mom's chest, and attended to by peds.  Cord double clamped after cessation of pulsation, cut by Pts daughter.   Third Stage: Placenta delivered Spontaneously intact with 3VC @ 0345 Placenta disposition: routine disposal Uterine tone Firm / bleeding small  No cervical, vaginal or perineal lacerations identified  Anesthesia for repair: n/a Est. Blood Loss (mL):  Complications: none  Mom to postpartum.  Baby to Couplet care / Skin to Skin.  Newborn: Birth Weight: 7#5  Apgar Scores: 9/9 Feeding planned: breast and formula

## 2020-05-20 ENCOUNTER — Ambulatory Visit: Payer: Self-pay | Admitting: Advanced Practice Midwife

## 2020-05-20 ENCOUNTER — Other Ambulatory Visit: Payer: Self-pay

## 2020-05-20 DIAGNOSIS — O99211 Obesity complicating pregnancy, first trimester: Secondary | ICD-10-CM

## 2020-05-20 DIAGNOSIS — O0990 Supervision of high risk pregnancy, unspecified, unspecified trimester: Secondary | ICD-10-CM

## 2020-05-20 DIAGNOSIS — O09522 Supervision of elderly multigravida, second trimester: Secondary | ICD-10-CM

## 2020-05-20 NOTE — Progress Notes (Addendum)
Patient here for MH RV at 34 1/7. Patient asking questions about covid vaccines. Covid vaccine contact information card given.Burt Knack, RN

## 2020-05-20 NOTE — Progress Notes (Signed)
   PRENATAL VISIT NOTE  Subjective:  Ana Ali is a 45 y.o. [phone] at [redacted]w[redacted]d being seen today for ongoing prenatal care.  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  Patient reports no complaints.  Contractions: Not present. Vag. Bleeding: None.  Movement: Present. Denies leaking of fluid/ROM.   The following portions of the patient's history were reviewed and updated as appropriate: allergies, current medications, past family history, past medical history, past social history, past surgical history and problem list. Problem list updated.  Objective:   Vitals:   05/20/20 1508  BP: 113/79  Pulse: 87  Temp: 98.9 F (37.2 C)  Weight: 226 lb 9.6 oz (102.8 kg)    Fetal Status: Fetal Heart Rate (bpm): 160 Fundal Height: 34 cm Movement: Present     General:  Alert, oriented and cooperative. Patient is in no acute distress.  Skin: Skin is warm and dry. No rash noted.   Cardiovascular: Normal heart rate noted  Respiratory: Normal respiratory effort, no problems with respiration noted  Abdomen: Soft, gravid, appropriate for gestational age.  Pain/Pressure: Absent     Pelvic: Cervical exam deferred        Extremities: Normal range of motion.  Edema: None  Mental Status: Normal mood and affect. Normal behavior. Normal judgment and thought content.   Assessment and Plan:  Pregnancy: [id number] at [redacted]w[redacted]d  1. Maternal morbid obesity in first trimester, antepartum (HCC) 210 lbs with BMI=35.0 36 lb 9.6 oz (16.6 kg) Taking ASA 81 mg daily  2. Multigravida of advanced maternal age in second trimester Reviewed 05/14/20 u/s  at 34 3/7 wks for AMA with normal interval growth, AFI wnl, EFW=46%.  Recommend f/u growth u/s in 4 wks and weekly AP NST beginning 36 wks. F/U growth u/s on 06/11/20 Needs IOL on EDC due to AMA  3. Supervision of high risk pregnancy, antepartum Feels well. Not working Decided to get Covid vaccine today   Preterm labor symptoms and general obstetric precautions including but not limited to vaginal bleeding, contractions, leaking of fluid and fetal movement were reviewed in detail with the patient. Please refer to After Visit Summary for other counseling recommendations.  Return in about 15 days (around 06/04/2020) for routine PNC, NST.  Future Appointments  Date Time Provider Department Center  06/11/2020  8:00 AM [facility]    Alberteen Spindle, CNM

## 2020-06-04 ENCOUNTER — Other Ambulatory Visit: Payer: Self-pay

## 2020-06-04 ENCOUNTER — Ambulatory Visit: Payer: Self-pay | Admitting: Physician Assistant

## 2020-06-04 VITALS — BP 110/70 | HR 84 | Temp 98.4°F | Wt 228.4 lb

## 2020-06-04 DIAGNOSIS — O0990 Supervision of high risk pregnancy, unspecified, unspecified trimester: Secondary | ICD-10-CM

## 2020-06-04 DIAGNOSIS — O99211 Obesity complicating pregnancy, first trimester: Secondary | ICD-10-CM

## 2020-06-04 MED ORDER — PRENATAL VITAMIN 27-0.8 MG PO TABS: 1.0000 | ORAL_TABLET | Freq: Every day | ORAL | 0 refills | Status: DC

## 2020-06-04 NOTE — Progress Notes (Signed)
  PRENATAL VISIT NOTE  Subjective:  Ana Ali is a 45 y.o. [phone] at [redacted]w[redacted]d being seen today for ongoing prenatal care.  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  Patient reports no complaints.  Contractions: Irritability. Vag. Bleeding: None.  Movement: Present. Denies leaking of fluid/ROM.   The following portions of the patient's history were reviewed and updated as appropriate: allergies, current medications, past family history, past medical history, past social history, past surgical history and problem list. Problem list updated.  Objective:   Vitals:   06/04/20 0847  BP: 110/70  Pulse: 84  Temp: 98.4 F (36.9 C)  Weight: 228 lb 6.4 oz (103.6 kg)    Fetal Status: Fetal Heart Rate (bpm): 164 Fundal Height: 37 cm Movement: Present  Presentation: Vertex  General:  Alert, oriented and cooperative. Patient is in no acute distress.  Skin: Skin is warm and dry. No rash noted.   Cardiovascular: Normal heart rate noted  Respiratory: Normal respiratory effort, no problems with respiration noted  Abdomen: Soft, gravid, appropriate for gestational age.  Pain/Pressure: Absent     Pelvic: Cervical exam deferred        Extremities: Normal range of motion.  Edema: None  Mental Status: Normal mood and affect. Normal behavior. Normal judgment and thought content.   Assessment and Plan:  Pregnancy: [id number] at [redacted]w[redacted]d  1. Supervision of high risk pregnancy, antepartum Anticipatory guidance re: transition to weekly visits. - Culture, beta strep (group b only) - Chlamydia/GC NAA, Confirmation  2. Maternal morbid  obesity in first trimester, antepartum (HCC) 210 lbs with BMI=35.0 NST today. To keep 06/11/20 [place] appt as sched. Plan IOL at EDD if not del prior. Discontinue low-dose aspirin today.   Preterm labor symptoms and general obstetric precautions including but not limited to vaginal bleeding, contractions, leaking of fluid and fetal movement were reviewed in detail with the patient. Please refer to After Visit Summary for other counseling recommendations.  Return in about 1 week (around 06/11/2020) for Routine prenatal care.  Future Appointments  Date Time Provider Department Center  06/11/2020  8:00 AM [facility]    Landry Dyke, PA-C

## 2020-06-04 NOTE — Progress Notes (Signed)
Patient started on NST at 9:10am, patient states she ate breakfast this morning. Patient NST reviewed NST and patient NST ended 9:40am, patient given more PNV and sent to clerical to schedule one week appointment with NST.Marland KitchenBurt Knack, RN

## 2020-06-04 NOTE — Progress Notes (Signed)
Patient here for MH RV and NST at 36 2/7. 36 week labs and packet today. Counseled to stop taking ASA now, patient states understanding. Needs more PNV today.Burt Knack, RN

## 2020-06-07 LAB — CULTURE, BETA STREP (GROUP B ONLY): Strep Gp B Culture: POSITIVE — AB

## 2020-06-08 DIAGNOSIS — B951 Streptococcus, group B, as the cause of diseases classified elsewhere: Secondary | ICD-10-CM | POA: Insufficient documentation

## 2020-06-08 LAB — CHLAMYDIA/GC NAA, CONFIRMATION
Chlamydia trachomatis, NAA: NEGATIVE
Neisseria gonorrhoeae, NAA: NEGATIVE

## 2020-06-09 ENCOUNTER — Other Ambulatory Visit: Payer: Self-pay | Admitting: Maternal & Fetal Medicine

## 2020-06-09 DIAGNOSIS — O09523 Supervision of elderly multigravida, third trimester: Secondary | ICD-10-CM

## 2020-06-09 DIAGNOSIS — O99213 Obesity complicating pregnancy, third trimester: Secondary | ICD-10-CM

## 2020-06-11 ENCOUNTER — Other Ambulatory Visit: Payer: Self-pay | Admitting: Maternal & Fetal Medicine

## 2020-06-11 ENCOUNTER — Other Ambulatory Visit: Payer: Self-pay

## 2020-06-11 ENCOUNTER — Ambulatory Visit: Payer: Self-pay | Attending: Obstetrics

## 2020-06-11 DIAGNOSIS — O99213 Obesity complicating pregnancy, third trimester: Secondary | ICD-10-CM

## 2020-06-11 DIAGNOSIS — B951 Streptococcus, group B, as the cause of diseases classified elsewhere: Secondary | ICD-10-CM

## 2020-06-11 DIAGNOSIS — O09523 Supervision of elderly multigravida, third trimester: Secondary | ICD-10-CM

## 2020-06-11 DIAGNOSIS — O0990 Supervision of high risk pregnancy, unspecified, unspecified trimester: Secondary | ICD-10-CM

## 2020-06-11 DIAGNOSIS — Z3A37 37 weeks gestation of pregnancy: Secondary | ICD-10-CM

## 2020-06-11 DIAGNOSIS — E669 Obesity, unspecified: Secondary | ICD-10-CM

## 2020-06-12 ENCOUNTER — Ambulatory Visit: Payer: Self-pay | Admitting: Advanced Practice Midwife

## 2020-06-12 VITALS — BP 117/73 | HR 94 | Temp 98.2°F | Wt 229.2 lb

## 2020-06-12 DIAGNOSIS — O99211 Obesity complicating pregnancy, first trimester: Secondary | ICD-10-CM

## 2020-06-12 DIAGNOSIS — O09523 Supervision of elderly multigravida, third trimester: Secondary | ICD-10-CM

## 2020-06-12 DIAGNOSIS — Z8759 Personal history of other complications of pregnancy, childbirth and the puerperium: Secondary | ICD-10-CM

## 2020-06-12 DIAGNOSIS — O0990 Supervision of high risk pregnancy, unspecified, unspecified trimester: Secondary | ICD-10-CM

## 2020-06-12 NOTE — Progress Notes (Signed)
Presents for MH RV at 37.3 weeks. Denies ED/Hospital visit since last RV. Takes PNV daily. GBS + counseled, handout given. NST today. See 06/12/20 note for NST. Sharlyne Pacas, RN

## 2020-06-12 NOTE — Progress Notes (Signed)
NST initiated at 1120 with radial pulse of mom = 84. Client desired next NST / MHC RV appt on same day. Per Hazle Coca CNM, need NST in pm after BPP in am on 06/18/2020. Client has appt scheduled for 06/18/2020 at [facility] with arrival time of 2 pm. Written on appt reminder card and given to client. Jossie Ng, RN  After 23 minutes of monitoring, strip reviewed by Hazle Coca CNM and monitoring discontinued. Jossie Ng, RN

## 2020-06-12 NOTE — Progress Notes (Signed)
   PRENATAL VISIT NOTE  Subjective:  Ana Ali is a 45 y.o. [phone] at [redacted]w[redacted]d being seen today for ongoing prenatal care.  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  Patient reports no complaints.  Contractions: Irritability. Vag. Bleeding: None.  Movement: Present. Denies leaking of fluid/ROM.   The following portions of the patient's history were reviewed and updated as appropriate: allergies, current medications, past family history, past medical history, past social history, past surgical history and problem list. Problem list updated.  Objective:   Vitals:   06/12/20 1012  BP: 117/73  Pulse: 94  Temp: 98.2 F (36.8 C)  Weight: 229 lb 3.2 oz (104 kg)    Fetal Status: Fetal Heart Rate (bpm): 160 Fundal Height: 38 cm Movement: Present  Presentation: Vertex  General:  Alert, oriented and cooperative. Patient is in no acute distress.  Skin: Skin is warm and dry. No rash noted.   Cardiovascular: Normal heart rate noted  Respiratory: Normal respiratory effort, no problems with respiration noted  Abdomen: Soft, gravid, appropriate for gestational age.  Pain/Pressure: Absent     Pelvic: Cervical exam deferred        Extremities: Normal range of motion.  Edema: None  Mental Status: Normal mood and affect. Normal behavior. Normal judgment and thought content.   Assessment and Plan:  Pregnancy: [id number] at [redacted]w[redacted]d  1. Supervision of high risk pregnancy, antepartum 45 yo morbidly obese HF is having BPP's once a week with NST's.  Last BPP yesterday 8/8 with  appropriate growth, AFI wnl, EFW=62%, vtx, anterior placenta, at 37 4/7. Needs NST today=reactive.  Needs IOL at 39-40 wks.  Pt desires IOL on 06/23/20 at 39 wks--paperwork completed Has car seat. Counseled on +GBS. Next BPP 06/18/20 with NST and RV scheduled here that same day.  2. Multigravida of advanced maternal age in third trimester   3. History of macrosomic infant 9#8   4. Maternal morbid obesity in first trimester, antepartum (HCC) 210 lbs with BMI=35.0 39 lb 3.2 oz (17.8 kg) Stopped taking ASA 81 mg   Preterm labor symptoms and general obstetric precautions including but not limited to vaginal bleeding, contractions, leaking of fluid and fetal movement were reviewed in detail with the patient. Please refer to After Visit Summary for other counseling recommendations.  No follow-ups on file.  Future Appointments  Date Time Provider Department Center  06/18/2020  8:00 AM [facility]  06/18/2020  9:00 AM [facility] CONSULT [facility] None  06/25/2020  8:00 AM [facility]    Alberteen Spindle, CNM

## 2020-06-15 ENCOUNTER — Telehealth: Payer: Self-pay | Admitting: Student

## 2020-06-15 NOTE — Telephone Encounter (Signed)
TC to patient to inform about IOL date that is 0001 on 2/8. Explained that patient must received COVID test 2 days prior to induction, so Friday 2/4 is the recommended date to get the test. Pt states that she cannot get test on 2/4 and asks if she could get on 2/3. RN will clarify and RC to patient on 06/16/20. Pt verbalized understanding.

## 2020-06-16 ENCOUNTER — Telehealth: Payer: Self-pay | Admitting: Student

## 2020-06-16 ENCOUNTER — Other Ambulatory Visit: Payer: Self-pay | Admitting: Advanced Practice Midwife

## 2020-06-16 DIAGNOSIS — O99210 Obesity complicating pregnancy, unspecified trimester: Secondary | ICD-10-CM

## 2020-06-16 DIAGNOSIS — O09529 Supervision of elderly multigravida, unspecified trimester: Secondary | ICD-10-CM

## 2020-06-16 NOTE — Telephone Encounter (Signed)
TC to patient to clarify questions about COVID test. Pt plans to get COVID test on Friday 2/4. Sharlyne Pacas, RN

## 2020-06-16 NOTE — Telephone Encounter (Signed)
TC to patient with interpreter to f/u about covid test 2 days prior to induction on Friday 2/4. Patient requested to get test on 2/3 because she has an [place] appointment at 8am. Pt states that she does not have transportation on 12/4 because her husband cannot take the day off from work. Pt reports that someone called her shortly before this RN, and stated that she MUST get the COVID test on 2/4 because it has to be 2 days prior to induction. This RN is following up with Maternity coordinator and will call patient back. Patient expressed understanding. Sharlyne Pacas, RN

## 2020-06-17 ENCOUNTER — Other Ambulatory Visit: Payer: Self-pay | Admitting: Obstetrics and Gynecology

## 2020-06-17 NOTE — Progress Notes (Signed)
**Note Ali-Identified via Obfuscation** HPI:  Ana Ali is a 45 y.o. [phone] female and will be [redacted]w[redacted]d dated by u/s.  She will present to L&D for IOL for AMA and h/o macrosomic infant   Pregnancy Issues: 1. Obesity 2. AMA 44yo 3. 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 GBS positive 5. H/o macrosomic infant 9#8   Maternal Medical History:   Past Medical History:  Diagnosis Date  . Anemia   . History of abnormal cervical Pap smear    [place]  . History of spontaneous abortion    11/2018 - evaluation at [facility]  . History of urinary tract infection   . Vaginal Pap smear, abnormal     Past Surgical History:  Procedure Laterality Date  . CERVICAL BIOPSY    . DILATION AND CURETTAGE OF UTERUS    . no surgical history      No Known Allergies  Prior to Admission medications   Medication Sig Start Date End Date Taking? Authorizing Provider  Prenatal Vit-Fe Fumarate-FA (PRENATAL VITAMIN) 27-0.8 MG TABS Take 1 tablet by mouth daily. 06/04/20   Landry Dyke, PA-C     Prenatal care site: [facility]   Social History: She  reports that she has never smoked. She has never used smokeless tobacco. She reports previous alcohol use. She reports that she does not use drugs.  Family History: family history includes Diabetes in her mother; Hypertension in her mother; Uterine cancer (age of onset: 35) in her sister.   EFW: 06/11/20  3209g  7lb1oz @ 62%    Prenatal Labs: Blood type/Rh B pos   Antibody screen neg  Rubella Immune  Varicella Immune  RPR NR  HBsAg Neg  HIV NR  GC neg  Chlamydia neg  Genetic screening   1 hour GTT 77  3 hour GTT   GBS Pos     [place] MFM FETAL BPP WO NON STRESS  Result Date: 06/11/2020 ----------------------------------------------------------------------  OBSTETRICS REPORT                         (Signed Final 06/11/2020 09:18 am) ---------------------------------------------------------------------- Patient Info  ID #:         [id number]                          D.O.B.:  [birth year] (44 yrs)  Name:        Ana Ali            Visit Date: 06/11/2020 08:46 am               MONTERR ---------------------------------------------------------------------- Performed By  Attending:         Ma Rings MD         Ref. Address:      [address]                                                               [address],  [place], [place]                                                               [address]  Performed By:      Francene Boyers RDMS      [facility]          [facility]                                                               [facility]  Referred By:       Alberteen Spindle CNM ---------------------------------------------------------------------- Orders  #   Description                          Code         Ordered By  1   [place] MFM OB FOLLOW UP                  76816.01     Arnetha Courser  2   [place] MFM FETAL BPP WO NON              76819.01     [facility]      STRESS ----------------------------------------------------------------------  #   Order #                    Accession #                 Episode #  1   [id number]                  [id number]  2   [id number]                  [id number]                  [id number] ---------------------------------------------------------------------- Indications  Advanced maternal age multigravida 45+, third   O28.523  trimester  [redacted] weeks gestation of pregnancy                 Z3A.37  Obesity complicating pregnancy, third trimester O99.213 ---------------------------------------------------------------------- Fetal Evaluation  Num Of Fetuses:          1  Fetal Heart Rate(bpm):   155  Cardiac Activity:        Observed  Presentation:            Cephalic  Placenta:  Anterior  P. Cord  Insertion:       Previously Visualized  AFI Sum(cm)     %Tile       Largest Pocket(cm)  12              39          6.4  RUQ(cm)       RLQ(cm)        LUQ(cm)        LLQ(cm)  4             1.6            6.4            0 ---------------------------------------------------------------------- Biophysical Evaluation  Amniotic F.V:   Within normal limits        F. Tone:         Observed  F. Movement:    Observed                    Score:           8/8  F. Breathing:   Observed ---------------------------------------------------------------------- Biometry  BPD:      95.1   mm     G. Age:  38w 6d         94  %    CI:         82.18  %    70 - 86                                                           FL/HC:       21.2  %    20.8 - 22.6  HC:        331   mm     G. Age:  37w 5d         35  %    HC/AC:       0.98       0.92 - 1.05  AC:      338.3   mm     G. Age:  37w 5d         77  %    FL/BPD:      73.7  %    71 - 87  FL:       70.1   mm     G. Age:  36w 0d         18  %    FL/AC:       20.7  %    20 - 24  HUM:      59.2   mm     G. Age:  34w 2d         15  %  LV:         3.2  mm  Est. FW:    3209   gm      7 lb 1 oz     62  % ---------------------------------------------------------------------- Gestational Age  LMP:            37w 2d       Date:  09/24/19                   EDD:  06/30/20  U/S Today:  37w 4d                                         EDD:  06/28/20  Best:           37w 2d    Det. By:  LMP  (09/24/19)            EDD:  06/30/20 ---------------------------------------------------------------------- Anatomy  Cranium:                Appears normal         Aortic Arch:            Previously seen  Cavum:                  Previously seen        Ductal Arch:            Previously seen  Ventricles:             Appears normal         Diaphragm:              Previously seen  Choroid Plexus:         Previously seen        Stomach:                Appears normal, left                                                                          sided  Cerebellum:             Previously seen        Abdomen:                Previously seen  Posterior Fossa:        Previously seen        Abdominal Wall:         Previously seen  Nuchal Fold:            Previously seen        Cord Vessels:           Previously seen  Face:                   Orbits and profile     Kidneys:                Appear normal                          previously seen  Lips:                   Previously seen        Bladder:                Appears normal  Heart:                  Appears normal         Spine:                  Previously seen                          (  4CH, axis, and situs)  RVOT:                   Previously seen        Upper Extremities:      Previously seen  LVOT:                   Previously seen        Lower Extremities:      Previously seen ---------------------------------------------------------------------- Comments  This patient was seen for a follow up growth scan due to  advanced maternal age (45 years old) and maternal obesity.  She denies any problems since her last exam.  She was informed that the fetal growth and amniotic fluid level  appears appropriate for her gestational age.  A biophysical profile performed today was 8 out of 8.  Due to advanced maternal age greater than 109, we will continue  to see her for weekly biophysical profiles.  Another biophysical profile was scheduled in 1 week. ----------------------------------------------------------------------                    Ma Rings, MD Electronically Signed Final Report   06/11/2020 09:18 am ----------------------------------------------------------------------  [place] MFM OB FOLLOW UP  Result Date: 06/11/2020 ----------------------------------------------------------------------  OBSTETRICS REPORT                         (Signed Final 06/11/2020 09:18 am) ---------------------------------------------------------------------- Patient Info  ID #:        [id number]                           D.O.B.:  01/16/1976 (44 yrs)  Name:        Ana Ali            Visit Date: 06/11/2020 08:46 am               MONTERR ---------------------------------------------------------------------- Performed By  Attending:         Ma Rings MD         Ref. Address:      [address] [place],                                                               [place], [place]                                                               [address]  Performed By:      Francene Boyers RDMS      [facility]          [facility]                                                               [facility]  Referred By:       Alberteen Spindle CNM ---------------------------------------------------------------------- Orders  #   Description                          Code         Ordered By  1   [place] MFM OB FOLLOW UP                  76816.01     Arnetha Courser  2   [place] MFM FETAL BPP WO NON              76819.01     [facility]      STRESS ----------------------------------------------------------------------  #   Order #                    Accession #                 Episode #  1   [id number]                  [id number]                  [id number]  2   [id number]                  [id number]                  [id number] ---------------------------------------------------------------------- Indications  Advanced maternal age multigravida 53+, third   O13.523  trimester  [redacted] weeks gestation of pregnancy                 Z3A.37  Obesity complicating pregnancy, third trimester O99.213 ---------------------------------------------------------------------- Fetal Evaluation  Num Of Fetuses:          1  Fetal Heart Rate(bpm):   155  Cardiac Activity:        Observed  Presentation:            Cephalic  Placenta:                Anterior  P. Cord Insertion:       Previously  Visualized  AFI Sum(cm)     %Tile       Largest Pocket(cm)  12              39          6.4  RUQ(cm)       RLQ(cm)        LUQ(cm)        LLQ(cm)  4             1.6            6.4            0 ---------------------------------------------------------------------- Biophysical Evaluation  Amniotic F.V:   Within normal limits        F. Tone:         Observed  F. Movement:    Observed                    Score:           8/8  F. Breathing:   Observed ---------------------------------------------------------------------- Biometry  BPD:      95.1   mm     G. Age:  38w 6d         94  %    CI:         82.18  %    70 - 86                                                           FL/HC:       21.2  %    20.8 - 22.6  HC:        331   mm     G. Age:  37w 5d         35  %    HC/AC:       0.98       0.92 - 1.05  AC:      338.3   mm     G. Age:  37w 5d         77  %    FL/BPD:      73.7  %    71 - 87  FL:       70.1   mm     G. Age:  36w 0d         18  %    FL/AC:       20.7  %    20 - 24  HUM:      59.2   mm     G. Age:  34w 2d         15  %  LV:         3.2  mm  Est. FW:    3209   gm      7 lb 1 oz     62  % ---------------------------------------------------------------------- Gestational Age  LMP:            37w 2d       Date:  09/24/19                   EDD:  06/30/20  U/S Today:      37w 4d                                         EDD:  06/28/20  Best:           37w 2d    Det. By:  LMP  (09/24/19)            EDD:  06/30/20 ---------------------------------------------------------------------- Anatomy  Cranium:                Appears normal         Aortic Arch:            Previously seen  Cavum:                  Previously seen  Ductal Arch:            Previously seen  Ventricles:             Appears normal         Diaphragm:              Previously seen  Choroid Plexus:         Previously seen        Stomach:                Appears normal, left                                                                         sided   Cerebellum:             Previously seen        Abdomen:                Previously seen  Posterior Fossa:        Previously seen        Abdominal Wall:         Previously seen  Nuchal Fold:            Previously seen        Cord Vessels:           Previously seen  Face:                   Orbits and profile     Kidneys:                Appear normal                          previously seen  Lips:                   Previously seen        Bladder:                Appears normal  Heart:                  Appears normal         Spine:                  Previously seen                          (4CH, axis, and situs)  RVOT:                   Previously seen        Upper Extremities:      Previously seen  LVOT:                   Previously seen        Lower Extremities:      Previously seen ---------------------------------------------------------------------- Comments  This patient was seen for a follow up growth scan due to  advanced maternal age (44 years old) and maternal obesity.  She denies any problems since her last exam.  She was informed that the fetal growth and amniotic fluid level  appears appropriate for  her gestational age.  A biophysical profile performed today was 8 out of 8.  Due to advanced maternal age greater than 60, we will continue  to see her for weekly biophysical profiles.  Another biophysical profile was scheduled in 1 week. ----------------------------------------------------------------------                    Ma Rings, MD Electronically Signed Final Report   06/11/2020 09:18 am ----------------------------------------------------------------------    Post Partum Planning: - Tdap 04/06/20 - Flu 03/09/20  Haroldine Laws, CNM 06/17/2020 12:13 PM

## 2020-06-18 ENCOUNTER — Ambulatory Visit: Payer: Self-pay

## 2020-06-18 ENCOUNTER — Other Ambulatory Visit
Admission: RE | Admit: 2020-06-18 | Discharge: 2020-06-18 | Disposition: A | Discharge status: Home / Self Care | Payer: Self-pay | Source: Ambulatory Visit | Attending: Obstetrics and Gynecology | Admitting: Obstetrics and Gynecology

## 2020-06-18 ENCOUNTER — Other Ambulatory Visit: Payer: Self-pay

## 2020-06-18 ENCOUNTER — Ambulatory Visit: Payer: Self-pay | Admitting: Family Medicine

## 2020-06-18 ENCOUNTER — Ambulatory Visit (HOSPITAL_BASED_OUTPATIENT_CLINIC_OR_DEPARTMENT_OTHER): Payer: Self-pay

## 2020-06-18 VITALS — BP 122/80 | HR 83 | Temp 98.4°F | Wt 229.8 lb

## 2020-06-18 DIAGNOSIS — O0990 Supervision of high risk pregnancy, unspecified, unspecified trimester: Secondary | ICD-10-CM

## 2020-06-18 DIAGNOSIS — O99211 Obesity complicating pregnancy, first trimester: Secondary | ICD-10-CM

## 2020-06-18 DIAGNOSIS — E669 Obesity, unspecified: Secondary | ICD-10-CM | POA: Insufficient documentation

## 2020-06-18 DIAGNOSIS — B951 Streptococcus, group B, as the cause of diseases classified elsewhere: Secondary | ICD-10-CM

## 2020-06-18 DIAGNOSIS — O99213 Obesity complicating pregnancy, third trimester: Secondary | ICD-10-CM

## 2020-06-18 DIAGNOSIS — O09529 Supervision of elderly multigravida, unspecified trimester: Secondary | ICD-10-CM

## 2020-06-18 DIAGNOSIS — U071 COVID-19: Secondary | ICD-10-CM | POA: Insufficient documentation

## 2020-06-18 DIAGNOSIS — O09523 Supervision of elderly multigravida, third trimester: Secondary | ICD-10-CM

## 2020-06-18 DIAGNOSIS — O98513 Other viral diseases complicating pregnancy, third trimester: Secondary | ICD-10-CM | POA: Insufficient documentation

## 2020-06-18 DIAGNOSIS — Z3A38 38 weeks gestation of pregnancy: Secondary | ICD-10-CM

## 2020-06-18 DIAGNOSIS — Z01812 Encounter for preprocedural laboratory examination: Secondary | ICD-10-CM | POA: Insufficient documentation

## 2020-06-18 DIAGNOSIS — O99210 Obesity complicating pregnancy, unspecified trimester: Secondary | ICD-10-CM

## 2020-06-18 LAB — SARS CORONAVIRUS 2 (TAT 6-24 HRS): SARS Coronavirus 2: POSITIVE — AB

## 2020-06-18 NOTE — Progress Notes (Signed)
   PRENATAL VISIT NOTE  Subjective:  Ana Ali is a 45 y.o. [phone] at [redacted]w[redacted]d being seen today for ongoing prenatal care.  She is currently monitored for the following issues for this high-risk pregnancy and has Maternal morbid obesity in first trimester, antepartum (HCC) 210 lbs with BMI=35.0; Advanced maternal age in multigravida 45 yo; paternal age=45; Supervision of high risk pregnancy, antepartum; Limited literacy 6th grade education in [place]--needs assistance with reading and writing; Positive GBS test; and History of macrosomic infant 9#8 on their problem list.  Patient reports occasional contractions.  Contractions: Irregular. Vag. Bleeding: None.  Movement: Present. Denies leaking of fluid/ROM.   The following portions of the patient's history were reviewed and updated as appropriate: allergies, current medications, past family history, past medical history, past social history, past surgical history and problem list. Problem list updated.  Objective:   Vitals:   06/18/20 1420  BP: 122/80  Pulse: 83  Temp: 98.4 F (36.9 C)  Weight: 229 lb 12.8 oz (104.2 kg)    Fetal Status: Fetal Heart Rate (bpm): 168 Fundal Height: 39 cm Movement: Present  Presentation: Vertex  General:  Alert, oriented and cooperative. Patient is in no acute distress.  Skin: Skin is warm and dry. No rash noted.   Cardiovascular: Normal heart rate noted  Respiratory: Normal respiratory effort, no problems with respiration noted  Abdomen: Soft, gravid, appropriate for gestational age.  Pain/Pressure: Absent     Pelvic: Cervical exam performed Dilation: 1 Effacement (%): Thick    Extremities: Normal range of motion.  Edema: Trace  Mental Status: Normal mood and affect. Normal behavior. Normal judgment and thought content.   Assessment and Plan:  Pregnancy: [id number] at [redacted]w[redacted]d  There are no diagnoses linked to this encounter.  Term labor symptoms and general obstetric precautions  including but not limited to vaginal bleeding, contractions, leaking of fluid and fetal movement were reviewed in detail with the patient. Please refer to After Visit Summary for other counseling recommendations.   1. Supervision of high risk pregnancy, antepartum  2. Multigravida of advanced maternal age in third trimester  3. Maternal morbid obesity in first trimester, antepartum (HCC) 210 lbs with BMI=35.0  -Reviewed how to get in touch with [facility]  hospital, discussed when to call.  Patient has IOL date scheduled for 06/23/20 @ 001.  Patient was tested for Covid today as part of preadmission, PCR pending- encourage quarantine  until time for IOL   Patient has bag ready to go to the hospital.  Patient prefers daughter compared to spouse in for delivery, d/t him becoming faint like during [place].    Baby ready-  patient has car seat seat, and sleeping arrangements ready for baby.    -Reviewed breastfeeding support at [facility] and in [facility] clinic.  -Reviewed contraceptive plans and patient still desires depo.    Discussed with patient about scheduling PP visit, scheduling is not available that far in advance.    NST completed today, Read by Marye Round, CNM  Return for postpartum.  No future appointments.   Spanish interpreter  V. Olmedo used   Wendi Snipes, FNP

## 2020-06-18 NOTE — Progress Notes (Signed)
Patient here for MH RV at 38 2/7. States she went for U/S this morning but that she did not see the doctor afterwards. She states she was covid tested today at the hospital, and was told to stay home until induction on 06/23/2020 at 0001.Marland KitchenBurt Knack, RN

## 2020-06-19 ENCOUNTER — Telehealth: Payer: Self-pay

## 2020-06-19 NOTE — Telephone Encounter (Signed)
Pt. Instructed she does not need any treatment due to no symptoms. Verbalizes understanding. Used Spanish interpreter # [phone].

## 2020-06-19 NOTE — Telephone Encounter (Signed)
Called to discuss with patient about COVID-19 symptoms and the use of one of the available treatments for those with mild to moderate Covid symptoms and at a high risk of hospitalization.  Pt appears to qualify for outpatient treatment due to co-morbid conditions and/or a member of an at-risk group in accordance with the FDA Emergency Use Authorization.    Symptom onset: None Vaccinated: Yes Booster? Yes Immunocompromised? no Qualifiers: Pregnancy  Using Spanish interpreter [place] # [phone], pt. Would like to discuss treatment if appropriate. Discussed with patient via phone. I have asked them to expect a call from APP to discuss treatment options. Moved to pre-screened list.   Ana Ali

## 2020-06-20 ENCOUNTER — Inpatient Hospital Stay
Admission: EM | Admit: 2020-06-20 | Discharge: 2020-06-22 | DRG: 807 | Disposition: A | Discharge status: Home / Self Care | Payer: Medicaid Other | Attending: Obstetrics and Gynecology | Admitting: Obstetrics and Gynecology

## 2020-06-20 DIAGNOSIS — O99214 Obesity complicating childbirth: Secondary | ICD-10-CM | POA: Diagnosis present

## 2020-06-20 DIAGNOSIS — Z3A38 38 weeks gestation of pregnancy: Secondary | ICD-10-CM | POA: Diagnosis not present

## 2020-06-20 DIAGNOSIS — O09523 Supervision of elderly multigravida, third trimester: Secondary | ICD-10-CM | POA: Diagnosis present

## 2020-06-20 DIAGNOSIS — O26893 Other specified pregnancy related conditions, third trimester: Secondary | ICD-10-CM | POA: Diagnosis present

## 2020-06-20 DIAGNOSIS — O99824 Streptococcus B carrier state complicating childbirth: Secondary | ICD-10-CM | POA: Diagnosis present

## 2020-06-20 LAB — CBC
HCT: 51 % — ABNORMAL HIGH (ref 36.0–46.0)
Hemoglobin: 17.5 g/dL — ABNORMAL HIGH (ref 12.0–15.0)
MCH: 29.5 pg (ref 26.0–34.0)
MCHC: 34.3 g/dL (ref 30.0–36.0)
MCV: 86 fL (ref 80.0–100.0)
Platelets: 199 10*3/uL (ref 150–400)
RBC: 5.93 MIL/uL — ABNORMAL HIGH (ref 3.87–5.11)
RDW: 12.7 % (ref 11.5–15.5)
WBC: 6.2 10*3/uL (ref 4.0–10.5)
nRBC: 0 % (ref 0.0–0.2)

## 2020-06-20 LAB — TYPE AND SCREEN

## 2020-06-20 MED ORDER — ONDANSETRON HCL 4 MG/2ML IJ SOLN: 4.0000 mg | Freq: Four times a day (QID) | INTRAMUSCULAR | Status: DC | PRN

## 2020-06-20 MED ORDER — SOD CITRATE-CITRIC ACID 500-334 MG/5ML PO SOLN: 30.0000 mL | ORAL | Status: DC | PRN

## 2020-06-20 MED ORDER — LACTATED RINGERS IV SOLN: 500.0000 mL | INTRAVENOUS | Status: DC | PRN

## 2020-06-20 MED ORDER — OXYTOCIN-SODIUM CHLORIDE 30-0.9 UT/500ML-% IV SOLN: 2.5000 [IU]/h | INTRAVENOUS | Status: DC

## 2020-06-20 MED ORDER — OXYCODONE-ACETAMINOPHEN 5-325 MG PO TABS: 2.0000 | ORAL_TABLET | ORAL | Status: DC | PRN

## 2020-06-20 MED ORDER — OXYTOCIN BOLUS FROM INFUSION
333.0000 mL | Freq: Once | INTRAVENOUS | Status: AC
  Administered 2020-06-21: 333 mL via INTRAVENOUS

## 2020-06-20 MED ORDER — OXYTOCIN-SODIUM CHLORIDE 30-0.9 UT/500ML-% IV SOLN: 1.0000 m[IU]/min | INTRAVENOUS | Status: DC

## 2020-06-20 MED ORDER — OXYTOCIN 10 UNIT/ML IJ SOLN
INTRAMUSCULAR | Status: AC
  Filled 2020-06-20: qty 2

## 2020-06-20 MED ORDER — SODIUM CHLORIDE 0.9 % IV SOLN
5.0000 10*6.[IU] | Freq: Once | INTRAVENOUS | Status: AC
  Administered 2020-06-20: 5 10*6.[IU] via INTRAVENOUS
  Filled 2020-06-20: qty 5

## 2020-06-20 MED ORDER — LIDOCAINE HCL (PF) 1 % IJ SOLN
INTRAMUSCULAR | Status: AC
  Filled 2020-06-20: qty 30

## 2020-06-20 MED ORDER — PENICILLIN G POT IN DEXTROSE 60000 UNIT/ML IV SOLN
3.0000 10*6.[IU] | INTRAVENOUS | Status: DC
  Administered 2020-06-21: 3 10*6.[IU] via INTRAVENOUS
  Filled 2020-06-20: qty 50

## 2020-06-20 MED ORDER — AMMONIA AROMATIC IN INHA
RESPIRATORY_TRACT | Status: AC
  Filled 2020-06-20: qty 10

## 2020-06-20 MED ORDER — MISOPROSTOL 25 MCG QUARTER TABLET: 25.0000 ug | ORAL_TABLET | ORAL | Status: DC | PRN

## 2020-06-20 MED ORDER — ACETAMINOPHEN 325 MG PO TABS: 650.0000 mg | ORAL_TABLET | ORAL | Status: DC | PRN

## 2020-06-20 MED ORDER — TERBUTALINE SULFATE 1 MG/ML IJ SOLN: 0.2500 mg | Freq: Once | INTRAMUSCULAR | Status: DC | PRN

## 2020-06-20 MED ORDER — LIDOCAINE HCL (PF) 1 % IJ SOLN: 30.0000 mL | INTRAMUSCULAR | Status: DC | PRN

## 2020-06-20 MED ORDER — OXYCODONE-ACETAMINOPHEN 5-325 MG PO TABS: 1.0000 | ORAL_TABLET | ORAL | Status: DC | PRN

## 2020-06-20 MED ORDER — OXYTOCIN-SODIUM CHLORIDE 30-0.9 UT/500ML-% IV SOLN
INTRAVENOUS | Status: AC
  Filled 2020-06-20: qty 500

## 2020-06-20 MED ORDER — MISOPROSTOL 200 MCG PO TABS
ORAL_TABLET | ORAL | Status: AC
  Filled 2020-06-20: qty 4

## 2020-06-20 MED ORDER — LACTATED RINGERS IV SOLN: INTRAVENOUS | Status: DC

## 2020-06-20 NOTE — H&P (Signed)
**Note Ali-Identified via Obfuscation** OB History & Physical   History of Present Illness:  Chief Complaint: increasing painful UCs since this am.   HPI:  Ana Ali is a 45 y.o. [phone] female at [redacted]w[redacted]d dated by [place].  She presents to L&D for painful UCs since this am, currently about every 5-5min. Reports active FM, denies VB or LOF.  - known COVID Pos due to preprocedure screening, no symptoms. Pt reports she has received both doses of COVID vaccine.     Pregnancy Issues: 1. Obesity 2. AMA 44yo 3. 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 GBS positive 5. H/o macrosomic infant 9#8 6. Hx LEEP due to abnormal Pap, approx 2018-2019 per pt.    Maternal Medical History:   Past Medical History:  Diagnosis Date  . Anemia   . History of abnormal cervical Pap smear    [place]  . History of spontaneous abortion    11/2018 - evaluation at [facility]  . History of urinary tract infection   . Vaginal Pap smear, abnormal     Past Surgical History:  Procedure Laterality Date  . CERVICAL BIOPSY    . DILATION AND CURETTAGE OF UTERUS    . no surgical history      No Known Allergies  Prior to Admission medications   Medication Sig Start Date End Date Taking? Authorizing Provider  Prenatal Vit-Fe Fumarate-FA (PRENATAL VITAMIN) 27-0.8 MG TABS Take 1 tablet by mouth daily. 06/04/20   Landry Dyke, PA-C     Prenatal care site:  [facility]   Social History: She  reports that she has never smoked. She has never used smokeless tobacco. She reports previous alcohol use. She reports that she does not use drugs.  Family History: family history includes Diabetes in her mother; Hypertension in her mother; Uterine cancer (age of onset: 18) in her sister.   Review of Systems: A full review of systems was performed and negative except as noted in the HPI.     Physical Exam:  Vital Signs: LMP 09/24/2019 (Exact Date) Comment: EGA = 7 0/7 General: no acute distress.  HEENT:  normocephalic, atraumatic Heart: regular rate & rhythm.  No murmurs/rubs/gallops Lungs: clear to auscultation bilaterally, normal respiratory effort Abdomen: soft, gravid, non-tender;  EFW: 8lbs Pelvic:   External: Normal external female genitalia  Cervix: 3/100/-2 with intact BOW, cephalic. Tight band of scar tissue noted on cervix.    Extremities: non-tender, symmetric, no edema bilaterally.  DTRs: 2+  Neurologic: Alert & oriented x 3.    No results found for this or any previous visit (from the past 24 hour(s)).  Pertinent Results:  Prenatal Labs: Blood type/Rh  B pos  Antibody screen neg  Rubella Immune  Varicella Immune  RPR NR  HBsAg Neg  HIV NR  GC neg  Chlamydia neg  Genetic screening Negative NIPS and AFP, normal NT  1 hour GTT  77  3 hour GTT   GBS  Pos   FHT: TOCO: SVE:    /   /      Cephalic by leopolds  [place] MFM FETAL BPP WO NON STRESS  Result Date: 06/18/2020 ----------------------------------------------------------------------  OBSTETRICS REPORT                       (Signed Final 06/18/2020 10:16 am) ---------------------------------------------------------------------- Patient Info  ID #:       [id number]  D.O.B.:  [birth year] (44 yrs)  Name:       Ana Ali            Visit Date: 06/18/2020 08:22 am              MONTERR ---------------------------------------------------------------------- Performed By  Attending:        Noralee Space MD        Ref. Address:     [address],                                                             [place], [place]                                                             [address]  Performed By:     Neita Carp RDMS        [facility]         [facility]                                                             [facility]  Referred By:       Alberteen Spindle CNM ---------------------------------------------------------------------- Orders  #  Description                           Code        Ordered By  1  [place] MFM FETAL BPP WO NON               76819.01    [facility]     STRESS ----------------------------------------------------------------------  #  Order #                     Accession #                Episode #  1  [id number]                   [id number]  [id number] ---------------------------------------------------------------------- Indications  [redacted] weeks gestation of pregnancy                Z3A.69  Advanced maternal age multigravida 76+,        O87.523  third trimester  Obesity complicating pregnancy, third          O99.213  trimester ---------------------------------------------------------------------- Fetal Evaluation  Num Of Fetuses:         1  Fetal Heart Rate(bpm):  157  Cardiac Activity:       Observed  Presentation:           Cephalic  Placenta:               Anterior  AFI Sum(cm)     %Tile       Largest Pocket(cm)  12.74           48          4.28  RUQ(cm)       RLQ(cm)       LUQ(cm)        LLQ(cm)  4.02          1.6           4.28           2.84 ---------------------------------------------------------------------- Biophysical Evaluation  Amniotic F.V:   Within normal limits       F. Tone:        Observed  F. Movement:    Observed                   Score:          8/8  F. Breathing:   Observed ---------------------------------------------------------------------- Gestational Age  LMP:           38w 2d        Date:  09/24/19                 EDD:   06/30/20  Best:          38w 2d     Det. By:  LMP  (09/24/19)          EDD:   06/30/20 ---------------------------------------------------------------------- Anatomy  Stomach:               Appears normal, left   Bladder:                Appears normal                         sided ----------------------------------------------------------------------  Impression  Advanced maternal age.  Maternal obesity.  Patient return for  antenatal testing.  Blood pressure today at her office is  117/83 mmHg.  Amniotic fluid is normal and good fetal activity is seen  .Antenatal testing is reassuring. BPP 8/8.  Cephalic  presentation  Patient reports she will be undergoing induction of labor this  week. ----------------------------------------------------------------------                  Noralee Space, MD Electronically Signed Final Report   06/18/2020 10:16 am ----------------------------------------------------------------------  [place] MFM FETAL BPP WO NON STRESS  Result Date: 06/11/2020 ----------------------------------------------------------------------  OBSTETRICS REPORT                         (Signed Final 06/11/2020 09:18 am) ---------------------------------------------------------------------- Patient Info  ID #:        [id number]  D.O.B.:  Apr 30, 1976 (44 yrs)  Name:        SYMPHONI HELBLING Ali            Visit Date: 06/11/2020 08:46 am               MONTERR ---------------------------------------------------------------------- Performed By  Attending:         Ma Rings MD         Ref. Address:      [address] [place],                                                               [place], [place]                                                               [address]  Performed By:      Francene Boyers RDMS      Location[facility]          [facility] at                                                               [facility]  Referred By:       Alberteen Spindle CNM ---------------------------------------------------------------------- Orders  #   Description                          Code         Ordered By  1   [place] MFM OB FOLLOW UP                  76816.01     ELIZABETH SCIORA  2    [place] MFM FETAL BPP WO NON              13086.57     ELIZABETH SCIORA      STRESS ----------------------------------------------------------------------  #   Order #  Accession #                 Episode #  1   [id number]                  [id number]                  [id number]  2   [id number]                  [id number]                  [id number] ---------------------------------------------------------------------- Indications  Advanced maternal age multigravida 30+, third   O22.523  trimester  [redacted] weeks gestation of pregnancy                 Z3A.37  Obesity complicating pregnancy, third trimester O99.213 ---------------------------------------------------------------------- Fetal Evaluation  Num Of Fetuses:          1  Fetal Heart Rate(bpm):   155  Cardiac Activity:        Observed  Presentation:            Cephalic  Placenta:                Anterior  P. Cord Insertion:       Previously Visualized  AFI Sum(cm)     %Tile       Largest Pocket(cm)  12              39          6.4  RUQ(cm)       RLQ(cm)        LUQ(cm)        LLQ(cm)  4             1.6            6.4            0 ---------------------------------------------------------------------- Biophysical Evaluation  Amniotic F.V:   Within normal limits        F. Tone:         Observed  F. Movement:    Observed                    Score:           8/8  F. Breathing:   Observed ---------------------------------------------------------------------- Biometry  BPD:      95.1   mm     G. Age:  38w 6d         94  %    CI:         82.18  %    70 - 86                                                           FL/HC:       21.2  %    20.8 - 22.6  HC:        331   mm     G. Age:  37w 5d         35  %    HC/AC:       0.98       0.92 - 1.05  AC:      338.3   mm  G. Age:  37w 5d         77  %    FL/BPD:      73.7  %    71 - 87  FL:       70.1   mm     G. Age:  36w 0d         18  %    FL/AC:       20.7  %    20 - 24  HUM:      59.2   mm     G. Age:  34w 2d          15  %  LV:         3.2  mm  Est. FW:    3209   gm      7 lb 1 oz     62  % ---------------------------------------------------------------------- Gestational Age  LMP:            37w 2d       Date:  09/24/19                   EDD:  06/30/20  U/S Today:      37w 4d                                         EDD:  06/28/20  Best:           37w 2d    Det. By:  LMP  (09/24/19)            EDD:  06/30/20 ---------------------------------------------------------------------- Anatomy  Cranium:                Appears normal         Aortic Arch:            Previously seen  Cavum:                  Previously seen        Ductal Arch:            Previously seen  Ventricles:             Appears normal         Diaphragm:              Previously seen  Choroid Plexus:         Previously seen        Stomach:                Appears normal, left                                                                         sided  Cerebellum:             Previously seen        Abdomen:                Previously seen  Posterior Fossa:        Previously seen        Abdominal Wall:  Previously seen  Nuchal Fold:            Previously seen        Cord Vessels:           Previously seen  Face:                   Orbits and profile     Kidneys:                Appear normal                          previously seen  Lips:                   Previously seen        Bladder:                Appears normal  Heart:                  Appears normal         Spine:                  Previously seen                          (4CH, axis, and situs)  RVOT:                   Previously seen        Upper Extremities:      Previously seen  LVOT:                   Previously seen        Lower Extremities:      Previously seen ---------------------------------------------------------------------- Comments  This patient was seen for a follow up growth scan due to  advanced maternal age (45 years old) and maternal obesity.  She denies any problems since her last exam.   She was informed that the fetal growth and amniotic fluid level  appears appropriate for her gestational age.  A biophysical profile performed today was 8 out of 8.  Due to advanced maternal age greater than 5, we will continue  to see her for weekly biophysical profiles.  Another biophysical profile was scheduled in 1 week. ----------------------------------------------------------------------                    Ma Rings, MD Electronically Signed Final Report   06/11/2020 09:18 am ----------------------------------------------------------------------  [place] MFM OB FOLLOW UP  Result Date: 06/11/2020 ----------------------------------------------------------------------  OBSTETRICS REPORT                         (Signed Final 06/11/2020 09:18 am) ---------------------------------------------------------------------- Patient Info  ID #:        [id number]                          D.O.B.:  [birth year] (44 yrs)  Name:        Ellan Lambert Ali            Visit Date: 06/11/2020 08:46 am               MONTERR ---------------------------------------------------------------------- Performed By  Attending:         Ma Rings MD         Ref. Address:  [address]                                                               [place],                                                               [place], [place]                                                               [address]  Performed By:      Francene Boyers RDMS      [facility]          [facility] at                                                               [facility]  Referred By:       Alberteen Spindle CNM ---------------------------------------------------------------------- Orders  #   Description                          Code         Ordered By  1   [place] MFM OB FOLLOW UP                  76816.01     Arnetha Courser  2   [place] MFM FETAL BPP WO NON               76819.01     [facility]      STRESS ----------------------------------------------------------------------  #   Order #                    Accession #                 Episode #  1   [id number]                  [id number]                  [id number]  2   [id number]  [id number]                  [id number] ---------------------------------------------------------------------- Indications  Advanced maternal age multigravida 4235+, third   66O09.523  trimester  4137 weeks gestation of pregnancy                 Z3A.37  Obesity complicating pregnancy, third trimester O99.213 ---------------------------------------------------------------------- Fetal Evaluation  Num Of Fetuses:          1  Fetal Heart Rate(bpm):   155  Cardiac Activity:        Observed  Presentation:            Cephalic  Placenta:                Anterior  P. Cord Insertion:       Previously Visualized  AFI Sum(cm)     %Tile       Largest Pocket(cm)  12              39          6.4  RUQ(cm)       RLQ(cm)        LUQ(cm)        LLQ(cm)  4             1.6            6.4            0 ---------------------------------------------------------------------- Biophysical Evaluation  Amniotic F.V:   Within normal limits        F. Tone:         Observed  F. Movement:    Observed                    Score:           8/8  F. Breathing:   Observed ---------------------------------------------------------------------- Biometry  BPD:      95.1   mm     G. Age:  38w 6d         94  %    CI:         82.18  %    70 - 86                                                           FL/HC:       21.2  %    20.8 - 22.6  HC:        331   mm     G. Age:  37w 5d         35  %    HC/AC:       0.98       0.92 - 1.05  AC:      338.3   mm     G. Age:  37w 5d         77  %    FL/BPD:      73.7  %    71 - 87  FL:       70.1   mm     G. Age:  36w 0d         18  %    FL/AC:       20.7  %    20 -  24  HUM:      59.2   mm     G. Age:  34w 2d         15  %  LV:         3.2  mm   Est. FW:    3209   gm      7 lb 1 oz     62  % ---------------------------------------------------------------------- Gestational Age  LMP:            37w 2d       Date:  09/24/19                   EDD:  06/30/20  U/S Today:      37w 4d                                         EDD:  06/28/20  Best:           37w 2d    Det. By:  LMP  (09/24/19)            EDD:  06/30/20 ---------------------------------------------------------------------- Anatomy  Cranium:                Appears normal         Aortic Arch:            Previously seen  Cavum:                  Previously seen        Ductal Arch:            Previously seen  Ventricles:             Appears normal         Diaphragm:              Previously seen  Choroid Plexus:         Previously seen        Stomach:                Appears normal, left                                                                         sided  Cerebellum:             Previously seen        Abdomen:                Previously seen  Posterior Fossa:        Previously seen        Abdominal Wall:         Previously seen  Nuchal Fold:            Previously seen        Cord Vessels:           Previously seen  Face:                   Orbits and profile     Kidneys:  Appear normal                          previously seen  Lips:                   Previously seen        Bladder:                Appears normal  Heart:                  Appears normal         Spine:                  Previously seen                          (4CH, axis, and situs)  RVOT:                   Previously seen        Upper Extremities:      Previously seen  LVOT:                   Previously seen        Lower Extremities:      Previously seen ---------------------------------------------------------------------- Comments  This patient was seen for a follow up growth scan due to  advanced maternal age (45 years old) and maternal obesity.  She denies any problems since her last exam.  She was informed that the  fetal growth and amniotic fluid level  appears appropriate for her gestational age.  A biophysical profile performed today was 8 out of 8.  Due to advanced maternal age greater than 46, we will continue  to see her for weekly biophysical profiles.  Another biophysical profile was scheduled in 1 week. ----------------------------------------------------------------------                    Ma Rings, MD Electronically Signed Final Report   06/11/2020 09:18 am ----------------------------------------------------------------------   Assessment:  Kylen Nohelani Benning is a 45 y.o. [phone] female at [redacted]w[redacted]d with early labor, + GBS.    Plan:  1. Admit to Labor & Delivery; consents reviewed and obtained - COVID Pos, preadmission screening done 06/19/20; asymptomatic and s/p vaccination.  - hx macrosomia with prior SVD, no problems with delivery.  - Hx LEEP/Cryo, pt unsure what procedure was done.   2. Fetal Well being  - Fetal Tracing: Cat I tracing - Group B Streptococcus ppx indicated: Positive - Presentation: cephalic confirmed by exam   3. Routine OB: - Prenatal labs reviewed, as above - Rh B pos - CBC, T&S, RPR on admit - Clear fluids, IVF  4. Monitoring of Labor -  Contractions: external toco in place -  Pelvis proven to 4300grams -  Plan for augmentation with Pitocin and AROM prn.  -  Plan for continuous fetal monitoring  -  Maternal pain control as desired - Anticipate vaginal delivery  5. Post Partum Planning: - Infant feeding: breast - Contraception: Depo - Tdap 04/06/20 - Flu 03/09/20  Prudencio Pair Elianne Gubser, CNM 06/20/20 10:09 PM

## 2020-06-21 ENCOUNTER — Inpatient Hospital Stay: Payer: Medicaid Other | Admitting: Anesthesiology

## 2020-06-21 ENCOUNTER — Encounter: Payer: Self-pay | Admitting: Obstetrics and Gynecology

## 2020-06-21 ENCOUNTER — Other Ambulatory Visit: Payer: Self-pay

## 2020-06-21 LAB — CBC
HCT: 35.2 % — ABNORMAL LOW (ref 36.0–46.0)
Hemoglobin: 12 g/dL (ref 12.0–15.0)
MCH: 29.9 pg (ref 26.0–34.0)
MCHC: 34.1 g/dL (ref 30.0–36.0)
MCV: 87.8 fL (ref 80.0–100.0)
Platelets: 277 10*3/uL (ref 150–400)
RBC: 4.01 MIL/uL (ref 3.87–5.11)
RDW: 12.8 % (ref 11.5–15.5)
WBC: 17.3 10*3/uL — ABNORMAL HIGH (ref 4.0–10.5)
nRBC: 0 % (ref 0.0–0.2)

## 2020-06-21 LAB — TYPE AND SCREEN
ABO/RH(D): B POS
Antibody Screen: NEGATIVE

## 2020-06-21 LAB — RPR: RPR Ser Ql: NONREACTIVE

## 2020-06-21 LAB — ABO/RH: ABO/RH(D): B POS

## 2020-06-21 MED ORDER — PRENATAL MULTIVITAMIN CH
1.0000 | ORAL_TABLET | Freq: Every day | ORAL | Status: DC
  Administered 2020-06-21 – 2020-06-22 (×2): 1 via ORAL
  Filled 2020-06-21 (×2): qty 1

## 2020-06-21 MED ORDER — DIPHENHYDRAMINE HCL 25 MG PO CAPS: 25.0000 mg | ORAL_CAPSULE | Freq: Four times a day (QID) | ORAL | Status: DC | PRN

## 2020-06-21 MED ORDER — ZOLPIDEM TARTRATE 5 MG PO TABS: 5.0000 mg | ORAL_TABLET | Freq: Every evening | ORAL | Status: DC | PRN

## 2020-06-21 MED ORDER — PHENYLEPHRINE 40 MCG/ML (10ML) SYRINGE FOR IV PUSH (FOR BLOOD PRESSURE SUPPORT): 80.0000 ug | PREFILLED_SYRINGE | INTRAVENOUS | Status: DC | PRN

## 2020-06-21 MED ORDER — FENTANYL 2.5 MCG/ML W/ROPIVACAINE 0.15% IN NS 100 ML EPIDURAL (ARMC)
12.0000 mL/h | EPIDURAL | Status: DC
  Administered 2020-06-21: 12 mL/h via EPIDURAL

## 2020-06-21 MED ORDER — ACETAMINOPHEN 325 MG PO TABS
650.0000 mg | ORAL_TABLET | ORAL | Status: DC | PRN
  Administered 2020-06-21: 650 mg via ORAL
  Filled 2020-06-21: qty 2

## 2020-06-21 MED ORDER — FENTANYL 2.5 MCG/ML W/ROPIVACAINE 0.15% IN NS 100 ML EPIDURAL (ARMC)
EPIDURAL | Status: AC
  Filled 2020-06-21: qty 100

## 2020-06-21 MED ORDER — COCONUT OIL OIL
1.0000 "application " | TOPICAL_OIL | Status: DC | PRN
  Administered 2020-06-21: 1 via TOPICAL
  Filled 2020-06-21: qty 120

## 2020-06-21 MED ORDER — EPHEDRINE 5 MG/ML INJ: 10.0000 mg | INTRAVENOUS | Status: DC | PRN

## 2020-06-21 MED ORDER — IBUPROFEN 600 MG PO TABS
600.0000 mg | ORAL_TABLET | Freq: Four times a day (QID) | ORAL | Status: DC
Start: 2020-06-21 — End: 2020-06-22
  Administered 2020-06-21 – 2020-06-22 (×5): 600 mg via ORAL
  Filled 2020-06-21 (×5): qty 1

## 2020-06-21 MED ORDER — LIDOCAINE HCL (PF) 1 % IJ SOLN
INTRAMUSCULAR | Status: DC | PRN
  Administered 2020-06-21: 3 mL via SUBCUTANEOUS

## 2020-06-21 MED ORDER — DIPHENHYDRAMINE HCL 50 MG/ML IJ SOLN: 12.5000 mg | INTRAMUSCULAR | Status: DC | PRN

## 2020-06-21 MED ORDER — LIDOCAINE-EPINEPHRINE (PF) 1.5 %-1:200000 IJ SOLN
INTRAMUSCULAR | Status: DC | PRN
  Administered 2020-06-21: 3 mL via EPIDURAL

## 2020-06-21 MED ORDER — LACTATED RINGERS IV SOLN
500.0000 mL | Freq: Once | INTRAVENOUS | Status: AC
  Administered 2020-06-21: 500 mL via INTRAVENOUS

## 2020-06-21 MED ORDER — ONDANSETRON HCL 4 MG/2ML IJ SOLN: 4.0000 mg | INTRAMUSCULAR | Status: DC | PRN

## 2020-06-21 MED ORDER — DIBUCAINE (PERIANAL) 1 % EX OINT: 1.0000 "application " | TOPICAL_OINTMENT | CUTANEOUS | Status: DC | PRN

## 2020-06-21 MED ORDER — ONDANSETRON HCL 4 MG PO TABS
4.0000 mg | ORAL_TABLET | ORAL | Status: DC | PRN
  Filled 2020-06-21: qty 1

## 2020-06-21 MED ORDER — SIMETHICONE 80 MG PO CHEW: 80.0000 mg | CHEWABLE_TABLET | ORAL | Status: DC | PRN

## 2020-06-21 MED ORDER — WITCH HAZEL-GLYCERIN EX PADS
1.0000 "application " | MEDICATED_PAD | CUTANEOUS | Status: DC | PRN
  Administered 2020-06-21: 1 via TOPICAL
  Filled 2020-06-21: qty 100

## 2020-06-21 MED ORDER — BENZOCAINE-MENTHOL 20-0.5 % EX AERO
1.0000 "application " | INHALATION_SPRAY | CUTANEOUS | Status: DC | PRN
  Administered 2020-06-21: 1 via TOPICAL
  Filled 2020-06-21: qty 56

## 2020-06-21 MED ORDER — EPHEDRINE 5 MG/ML INJ
10.0000 mg | INTRAVENOUS | Status: DC | PRN
  Administered 2020-06-21: 10 mg via INTRAVENOUS
  Filled 2020-06-21: qty 4

## 2020-06-21 MED ORDER — SENNOSIDES-DOCUSATE SODIUM 8.6-50 MG PO TABS
2.0000 | ORAL_TABLET | Freq: Every day | ORAL | Status: DC
  Administered 2020-06-22: 2 via ORAL
  Filled 2020-06-21: qty 2

## 2020-06-21 MED ORDER — SODIUM CHLORIDE 0.9 % IV SOLN
INTRAVENOUS | Status: DC | PRN
  Administered 2020-06-21: 10 mL via EPIDURAL

## 2020-06-21 NOTE — Lactation Note (Signed)
This note was copied from a baby's chart. Lactation Consultation Note  Patient Name: Ana Ali Today's Date: 06/21/2020   Age:45 hours  RN asked for [facility] assistance with education and feeding attempt. Reports mom has already given a bottle due to believing she didn't have enough milk. Interpreter services were used to educate mom on colostrum, stomach size, and impact of early formula. LC in room to assist, mom and baby both sleeping in bed. LC spoke with mom and support person, and removed baby from bed, swaddled and placed in bassinet. Mom back to sleep within moments of baby being moved.    Danford Bad 06/21/2020, 12:26 PM

## 2020-06-21 NOTE — Discharge Summary (Signed)
Obstetrical Discharge Summary  Patient Name: Ana Ali DOB: [birth year] MRN: [id number]  Date of Admission: 06/20/2020 Date of Delivery: 06/21/20 Delivered by: Heloise Ochoa CNM Date of Discharge: 06/22/2020  Primary OB: [facility]  WUJ:[id number]'W last menstrual period was 09/24/2019 (exact date). EDC Estimated Date of Delivery: 06/30/20 Gestational Age at Delivery: [redacted]w[redacted]d   Antepartum complications:  1.Obesity 2. AMA 45yo 3. 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 GBS positive 5. H/o macrosomic infant 9#8 6. Hx LEEP due to abnormal Pap, approx 2018-2019 per pt.  7. COVID19 Pos on admission, no sx.   Admitting Diagnosis: Labor Secondary Diagnosis: SVD  Patient Active Problem List   Diagnosis Date Noted  . Advanced maternal age in multigravida, third trimester 06/20/2020  . History of macrosomic infant 9#8 06/12/2020  . Positive GBS test 06/08/2020  . 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 Advanced maternal age in multigravida 45 yo; paternal age=45 12/16/2019  . Supervision of high risk pregnancy, antepartum 12/16/2019  . Limited literacy 6th grade education in [place]--needs assistance with reading and writing 12/16/2019    Augmentation: AROM Complications: None Intrapartum complications/course: see delivery note Date of Delivery: 06/21/20 Delivered By: Heloise Ochoa CNM Delivery Type: spontaneous vaginal delivery Anesthesia: epidural Placenta: spontaneous Laceration: none Episiotomy: none Newborn Data: Live born female  Birth Weight:   APGAR: ,   Newborn Delivery   Birth date/time: 06/21/2020 03:39:00 Delivery type: Vaginal, Spontaneous     Postpartum Procedures: None  Edinburgh:  Edinburgh Postnatal Depression Scale Screening Tool 06/21/2020  I have been able to laugh and see the funny side of things. 1  I have looked forward with enjoyment to things. 0  I have blamed  myself unnecessarily when things went wrong. 0  I have been anxious or worried for no good reason. 0  I have felt scared or panicky for no good reason. 0  Things have been getting on top of me. 2  I have been so unhappy that I have had difficulty sleeping. 1  I have felt sad or miserable. 0  I have been so unhappy that I have been crying. 0  The thought of harming myself has occurred to me. 0  Edinburgh Postnatal Depression Scale Total 4      Post partum course:  Patient had an uncomplicated postpartum course.  By time of discharge on PPD#1, her pain was controlled on oral pain medications; she had appropriate lochia and was ambulating, voiding without difficulty and tolerating regular diet.  She was deemed stable for discharge to home.     Discharge Physical Exam:  BP 96/69 (BP Location: Right Arm)   Pulse 77   Temp 98.2 F (36.8 C) (Axillary)   Resp 18   Ht 5\' 5"  (1.651 m)   Wt 104.2 kg   LMP 09/24/2019 (Exact Date) Comment: EGA = 7 0/7  SpO2 99%   Breastfeeding Unknown   BMI 38.24 kg/m   General: NAD CV: RRR Pulm: CTABL, nl effort ABD: s/nd/nt, fundus firm and below the umbilicus Lochia: moderat Perineum: well approximated/intact DVT Evaluation: LE non-ttp, no evidence of DVT on exam.  Hemoglobin  Date Value Ref Range Status  06/22/2020 11.9 (L) 12.0 - 15.0 g/dL Final  08/20/2020 29/56/2130 11.1 - 15.9 g/dL Final   HCT  Date Value Ref Range Status  06/22/2020 34.4 (L) 36.0 - 46.0 % Final   Hematocrit  Date Value Ref Range Status  12/16/2019  35.5 34.0 - 46.6 % Final     Disposition: stable, discharge to home. Baby Feeding: breastmilk and formula Baby Disposition: home with mom  Rh Immune globulin given: n/a Rubella vaccine given: Immune Varicella vaccine given: Immune Tdap vaccine given in AP or PP setting: 04/06/20 Flu vaccine given in AP or PP setting: 03/09/20  Contraception: Nexplanon through [facility]  Prenatal Labs:  Blood type/Rh  B pos  Antibody  screen neg  Rubella Immune  Varicella Immune  RPR NR  HBsAg Neg  HIV NR  GC neg  Chlamydia neg  Genetic screening Negative NIPS and AFP, normal NT  1 hour GTT  77  3 hour GTT   GBS  Pos      Plan:  Ana Ali was discharged to home in good condition. Follow-up appointment with delivering provider in 6 weeks.  May follow up with [facility] if preferred.   Discharge Medications: Allergies as of 06/22/2020   No Known Allergies     Medication List    TAKE these medications   acetaminophen 325 MG tablet Commonly known as: Tylenol Take 2 tablets (650 mg total) by mouth every 4 (four) hours as needed (for pain scale < 4).   ibuprofen 600 MG tablet Commonly known as: ADVIL Take 1 tablet (600 mg total) by mouth every 6 (six) hours as needed.   Prenatal Vitamin 27-0.8 MG Tabs Take 1 tablet by mouth daily.        Follow-up Information    McVey, Prudencio Pair, CNM Follow up in 6 week(s).   Specialty: Obstetrics and Gynecology Why: routine PP visit, may choose to make appt at [facility] Contact information: [address] [place] [address] [phone]               Signed:  Gustavo Lah, CNM 06/22/2020  12:05 PM  Margaretmary Eddy, CNM Certified Nurse Midwife [facility]

## 2020-06-21 NOTE — Plan of Care (Signed)
  Problem: Education: Goal: Knowledge of Childbirth will improve Outcome: Progressing Goal: Ability to make informed decisions regarding treatment and plan of care will improve Outcome: Progressing Goal: Ability to state and carry out methods to decrease the pain will improve Outcome: Progressing   Problem: Coping: Goal: Ability to verbalize concerns and feelings about labor and delivery will improve Outcome: Progressing   Problem: Life Cycle: Goal: Ability to make normal progression through stages of labor will improve Outcome: Progressing Goal: Ability to effectively push during vaginal delivery will improve Outcome: Progressing   Problem: Role Relationship: Goal: Will demonstrate positive interactions with the child Outcome: Progressing   Problem: Safety: Goal: Risk of complications during labor and delivery will decrease Outcome: Progressing   Problem: Pain Management: Goal: Relief or control of pain from uterine contractions will improve Outcome: Progressing   Problem: Respiratory: Goal: Will maintain a patent airway Outcome: Progressing Goal: Complications related to the disease process, condition or treatment will be avoided or minimized Outcome: Progressing   Problem: Education: Goal: Knowledge of General Education information will improve Description: Including pain rating scale, medication(s)/side effects and non-pharmacologic comfort measures Outcome: Progressing

## 2020-06-21 NOTE — Progress Notes (Incomplete)
Ana Ali [id number] interpreteer on the line with stratus. McVey

## 2020-06-21 NOTE — Lactation Note (Signed)
This note was copied from a baby's chart. Lactation Consultation Note  Patient Name: Ana Ali Today's Date: 06/21/2020 Reason for consult: Initial assessment;Early term 37-38.6wks Age:44 hours  Lactation present for support with attempted feed. Interpreter services used for consult. This is mom's 4th baby, she has 2+yr experience with other 3 children but having difficulty with breastfeeding this one, has already introduced formula/bottle.  Baby is sleepy, not awake/alert with attempted feed. Attempted to latch baby in football hold several times, baby would open mouth, but could not sustain latch and would not suckle. Nipple shield introduced and mom taught how to place the shield, use, and cleaning. While baby's blood sugar was checked baby was alert enough to latch to the shield/breast, sucked a few times but fell back asleep. LC reviewed hand expression, encouraged hand expression before and post feeds, use of nipple shield with future feeds, and importance of offering breast before formula/bottle to help encourage the onset of an adequate milk supply. Mom verbalizes understanding and has no questions.  Maternal Data Has patient been taught Hand Expression?: Yes Does the patient have breastfeeding experience prior to this delivery?: Yes How long did the patient breastfeed?: 23yr+ all other 3 children  Feeding Mother's Current Feeding Choice: Breast Milk and Formula  LATCH Score Latch: Repeated attempts needed to sustain latch, nipple held in mouth throughout feeding, stimulation needed to elicit sucking reflex. (baby sleepy)  Audible Swallowing: None  Type of Nipple: Everted at rest and after stimulation (L if flat/slightly inverted)  Comfort (Breast/Nipple): Soft / non-tender  Hold (Positioning): Assistance needed to correctly position infant at breast and maintain latch.  LATCH Score: 6   Lactation Tools Discussed/Used Tools: Nipple Shields Nipple  shield size: 20  Interventions Interventions: Breast feeding basics reviewed;Assisted with latch;Skin to skin;Hand express;Adjust position;Support pillows  Discharge    Consult Status Consult Status: Follow-up Date: 06/21/20 Follow-up type: In-patient    Ana Ali 06/21/2020, 3:36 PM

## 2020-06-21 NOTE — Anesthesia Postprocedure Evaluation (Signed)
Anesthesia Post Note  Patient: Ana Ali  Procedure(s) Performed: AN AD HOC LABOR EPIDURAL  Patient location during evaluation: Mother Baby Anesthesia Type: Epidural Level of consciousness: awake and alert Pain management: pain level controlled Vital Signs Assessment: post-procedure vital signs reviewed and stable Respiratory status: spontaneous breathing, nonlabored ventilation and respiratory function stable Cardiovascular status: stable Postop Assessment: no headache, no backache, patient able to bend at knees and able to ambulate Anesthetic complications: no   No complications documented.   Last Vitals:  Vitals:   06/21/20 0729 06/21/20 0900  BP: 111/86   Pulse: (!) 131 72  Resp:    Temp: 36.9 C     Last Pain:  Vitals:   06/21/20 0750  TempSrc:   PainSc: 4                  Cleda Mccreedy Marybell Robards

## 2020-06-21 NOTE — Anesthesia Preprocedure Evaluation (Signed)
Anesthesia Evaluation  Patient identified by MRN, date of birth, ID band Patient awake  General Assessment Comment:g5p3 previous deliveries in [place] never had epidural.  Reviewed: Allergy & Precautions, NPO status , Patient's Chart, lab work & pertinent test results  History of Anesthesia Complications Negative for: history of anesthetic complications  Airway Mallampati: III  TM Distance: >3 FB Neck ROM: Full    Dental no notable dental hx. (+) Teeth Intact   Pulmonary neg pulmonary ROS, neg sleep apnea, neg COPD, Patient abstained from smoking.Not current smoker,    Pulmonary exam normal breath sounds clear to auscultation       Cardiovascular Exercise Tolerance: Good METS(-) hypertension(-) CAD and (-) Past MI negative cardio ROS  (-) dysrhythmias  Rhythm:Regular Rate:Normal - Systolic murmurs    Neuro/Psych negative neurological ROS  negative psych ROS   GI/Hepatic neg GERD  ,(+)     (-) substance abuse  ,   Endo/Other  neg diabetes  Renal/GU negative Renal ROS     Musculoskeletal   Abdominal   Peds  Hematology  (+) anemia ,   Anesthesia Other Findings Past Medical History: No date: Anemia No date: History of abnormal cervical Pap smear     Comment:  [place] No date: History of spontaneous abortion     Comment:  11/2018 - evaluation at [facility] No date: History of urinary tract infection No date: Vaginal Pap smear, abnormal  Reproductive/Obstetrics (+) Pregnancy                             Anesthesia Physical Anesthesia Plan  ASA: II  Anesthesia Plan: Epidural   Post-op Pain Management:    Induction:   PONV Risk Score and Plan: 2 and Treatment may vary due to age or medical condition and Ondansetron  Airway Management Planned: Natural Airway  Additional Equipment:   Intra-op Plan:   Post-operative Plan:   Informed Consent: I have reviewed the patients  History and Physical, chart, labs and discussed the procedure including the risks, benefits and alternatives for the proposed anesthesia with the patient or authorized representative who has indicated his/her understanding and acceptance.       Plan Discussed with: Surgeon  Anesthesia Plan Comments: (Discussed R/B/A of neuraxial anesthesia technique with patient: - rare risks of spinal/epidural hematoma, nerve damage, infection - Risk of PDPH - Risk of itching - Risk of nausea and vomiting - Risk of poor block necessitating replacement of epidural. Patient voiced understanding.)        Anesthesia Quick Evaluation

## 2020-06-21 NOTE — OB Triage Note (Signed)
Patient arrived to unit with complaints of contractions. Pt is G5P3 [redacted]w[redacted]d. Patient denies symptoms consistent with active vaginal bleeding and reports no loss of fluid. Pt placed on EFM to toco and non tender area of abdomen. Provider on unit was notified of patient's arrival and stated new orders for admission were placed. Will begin to admit patient for labor and delivery.

## 2020-06-21 NOTE — Anesthesia Procedure Notes (Signed)
Epidural Patient location during procedure: OB Start time: 06/21/2020 2:09 AM End time: 06/21/2020 2:25 AM  Staffing Anesthesiologist: Corinda Gubler, MD Performed: anesthesiologist   Preanesthetic Checklist Completed: patient identified, IV checked, site marked, risks and benefits discussed, surgical consent, monitors and equipment checked, pre-op evaluation and timeout performed  Epidural Patient position: sitting Prep: ChloraPrep Patient monitoring: heart rate, continuous pulse ox and blood pressure Approach: midline Location: L3-L4 Injection technique: LOR saline  Needle:  Needle type: Tuohy  Needle gauge: 17 G Needle length: 9 cm and 9 Needle insertion depth: 8 cm Catheter type: closed end flexible Catheter size: 19 Gauge Catheter at skin depth: 13 cm Test dose: negative and 1.5% lidocaine with Epi 1:200 K  Assessment Sensory level: T10 Events: blood not aspirated, injection not painful, no injection resistance, no paresthesia and negative IV test  Additional Notes first attempt Pt. Evaluated and documentation done after procedure finished. Patient identified. Risks/Benefits/Options discussed with patient including but not limited to bleeding, infection, nerve damage, paralysis, failed block, incomplete pain control, headache, blood pressure changes, nausea, vomiting, reactions to medication both or allergic, itching and postpartum back pain. Confirmed with bedside nurse the patient's most recent platelet count. Confirmed with patient that they are not currently taking any anticoagulation, have any bleeding history or any family history of bleeding disorders. Patient expressed understanding and wished to proceed. All questions were answered. Sterile technique was used throughout the entire procedure. Please see nursing notes for vital signs. Test dose was given through epidural catheter and negative prior to continuing to dose epidural or start infusion. Warning signs of high block  given to the patient including shortness of breath, tingling/numbness in hands, complete motor block, or any concerning symptoms with instructions to call for help. Patient was given instructions on fall risk and not to get out of bed. All questions and concerns addressed with instructions to call with any issues or inadequate analgesia.   Patient tolerated the insertion well without immediate complications.Reason for block:procedure for pain

## 2020-06-21 NOTE — Plan of Care (Signed)
  Problem: Education: Goal: Knowledge of Childbirth will improve 06/21/2020 0708 by Cartel Mauss, Johney Maine, RN Outcome: Completed/Met 06/21/2020 0449 by Ashlye Oviedo, Johney Maine, RN Outcome: Progressing Goal: Ability to make informed decisions regarding treatment and plan of care will improve 06/21/2020 0708 by Neilan Rizzo, Johney Maine, RN Outcome: Completed/Met 06/21/2020 0449 by Yasmene Salomone, Johney Maine, RN Outcome: Progressing Goal: Ability to state and carry out methods to decrease the pain will improve 06/21/2020 0708 by Clorinda Wyble, Johney Maine, RN Outcome: Completed/Met 06/21/2020 0449 by Ladon Heney, Johney Maine, RN Outcome: Progressing   Problem: Coping: Goal: Ability to verbalize concerns and feelings about labor and delivery will improve 06/21/2020 0708 by Shawanda Sievert, Johney Maine, RN Outcome: Completed/Met 06/21/2020 0449 by Amillion Macchia, Johney Maine, RN Outcome: Progressing   Problem: Life Cycle: Goal: Ability to make normal progression through stages of labor will improve 06/21/2020 0708 by Finian Helvey, Johney Maine, RN Outcome: Completed/Met 06/21/2020 0449 by Shahzad Thomann, Johney Maine, RN Outcome: Progressing Goal: Ability to effectively push during vaginal delivery will improve 06/21/2020 0708 by Takira Sherrin, Johney Maine, RN Outcome: Completed/Met 06/21/2020 0449 by Paddy Walthall, Johney Maine, RN Outcome: Progressing   Problem: Role Relationship: Goal: Will demonstrate positive interactions with the child 06/21/2020 0708 by Merlean Pizzini, Johney Maine, RN Outcome: Completed/Met 06/21/2020 0449 by Sirenia Whitis, Johney Maine, RN Outcome: Progressing   Problem: Safety: Goal: Risk of complications during labor and delivery will decrease 06/21/2020 0708 by Abdinasir Spadafore, Johney Maine, RN Outcome: Completed/Met 06/21/2020 0449 by Thaddus Mcdowell, Johney Maine, RN Outcome: Progressing   Problem: Pain Management: Goal: Relief or control of pain from uterine contractions will improve 06/21/2020 0708 by Cloa Bushong, Johney Maine, RN Outcome: Completed/Met 06/21/2020 0449 by Siddharth Babington, Johney Maine, RN Outcome: Progressing   Problem: Respiratory: Goal: Will maintain a patent airway 06/21/2020 0708  by Izan Miron, Johney Maine, RN Outcome: Completed/Met 06/21/2020 0449 by Tavionna Grout, Johney Maine, RN Outcome: Progressing Goal: Complications related to the disease process, condition or treatment will be avoided or minimized 06/21/2020 0708 by Shaney Deckman, Johney Maine, RN Outcome: Completed/Met 06/21/2020 0449 by Aidel Davisson, Johney Maine, RN Outcome: Progressing   Problem: Education: Goal: Knowledge of General Education information will improve Description: Including pain rating scale, medication(s)/side effects and non-pharmacologic comfort measures 06/21/2020 0708 by Kimm Sider, Johney Maine, RN Outcome: Completed/Met 06/21/2020 0449 by Mercia Dowe, Johney Maine, RN Outcome: Progressing

## 2020-06-22 LAB — CBC
HCT: 34.4 % — ABNORMAL LOW (ref 36.0–46.0)
Hemoglobin: 11.9 g/dL — ABNORMAL LOW (ref 12.0–15.0)
MCH: 30.7 pg (ref 26.0–34.0)
MCHC: 34.6 g/dL (ref 30.0–36.0)
MCV: 88.9 fL (ref 80.0–100.0)
Platelets: 283 10*3/uL (ref 150–400)
RBC: 3.87 MIL/uL (ref 3.87–5.11)
RDW: 13.1 % (ref 11.5–15.5)
WBC: 10.5 10*3/uL (ref 4.0–10.5)
nRBC: 0 % (ref 0.0–0.2)

## 2020-06-22 MED ORDER — ACETAMINOPHEN 325 MG PO TABS: 650.0000 mg | ORAL_TABLET | ORAL | Status: DC | PRN

## 2020-06-22 MED ORDER — IBUPROFEN 600 MG PO TABS: 600.0000 mg | ORAL_TABLET | Freq: Four times a day (QID) | ORAL | 1 refills | Status: DC | PRN

## 2020-06-22 NOTE — Discharge Instructions (Signed)
Consejos sobre la lactancia para un buen agarre Breastfeeding Tips for a Good Latch El agarre, o que el beb se prenda, se refiere a cmo la boca del beb se agarra al pezn al [employer]. Es una parte importante de la Market researcher. El beb puede tener problemas para prenderse a la mama por diversos motivos, tales como:  No estar en la posicin correcta.  Usar bibern o Chartered certified accountant.  Problemas en la boca, la lengua o los labios del beb.  La forma de los pezones de la [place].  El beb nace con demasiada anticipacin (prematuramente). Los bebs pequeos suelen succionar con debilidad.  Mamas saturadas de leche (congestin [place]).  Extrigase un poco de [place] para ablandar las [place]. Trabaje con un especialista en lactancia (asesor en lactancia) para ayudar a que el beb se prenda correctamente. Jill Alexanders modo me afecta? Si el beb no se prende bien, esto puede causarle los siguientes problemas:  Pezones agrietados.  Dolor en los pezones.  Mamas demasiado llenas de [place].  Obstruccin de los International aid/development worker.  Baja produccin de [place].  Inflamacin de las mamas.  Infeccin mamaria. Cmo afecta esto al beb? Si el beb no se prende bien, esto puede hacer que no pueda alimentarse adecuadamente. Como resultado de [place], puede tener problemas para aumentar de [place]. Siga estas instrucciones en su casa: En qu posicin poner al beb  Busque un lugar cmodo para sentarse o acostarse. Es importante que tenga bien apoyados el cuello y la espalda.  Si est sentada, coloque una almohada o una manta enrollada debajo del beb. De este modo, lo acercar al nivel de su mama.  Asegrese de que el vientre de su beb est frente al suyo.  Pruebe diferentes posiciones para hallar la que funcione mejor para usted y el beb. Cmo ayudar a que el beb se prenda  Para empezar, puede que le resulte til frotarse el pecho suavemente. Con las yemas de los dedos, masajee la pared del  pecho hacia el pezn en un movimiento circular. Esto estimula el flujo de la [place]. Si es necesario, contine Sports coach.  Posicione su mama. Sostenga la mama con el pulgar por arriba del pezn y los otros cuatro dedos por debajo de la mama. Mantenga los dedos lejos del pezn y de la boca del beb. Siga estos pasos para ayudar al beb a prenderse: 1. Frote suavemente los labios del beb con el pezn o con el dedo. 2. Cuando la boca del beb se abra lo suficiente, acrquelo rpidamente a la mama e introduzca todo el pezn dentro de la boca del beb. Introduzca en la boca del beb tanto de la zona de color que rodea el pezn (la arola)como sea posible. 3. La lengua del beb debe estar entre la enca inferior y la [place]. 4. Debe poder ver ms arola por arriba del labio superior del beb que por debajo del labio inferior. 5. Cuando el beb empiece a succionar, sentir un jaln suave en el pezn. No debera sentir nada de dolor. Tenga paciencia. Es comn que el beb succione durante 2 o antes de que comience el flujo de [place]. 6. Asegrese de que la boca del beb est posicionada correctamente alrededor del pezn. Los labios del beb deben crear un sello sobre la mama y estar orientados hacia fuera.   Instrucciones generales  Busque estos signos de que el beb se ha prendido bien al pezn: ? El beb tironea o succiona con tranquilidad, sin causarle dolor. ?  Oye que el beb traga despus de succionar 3 o 4 veces. ? Ve movimiento por arriba y delante de las orejas del beb mientras succiona.  Preste atencin a estos signos de que el beb no se ha prendido bien al pezn: ? El beb hace sonidos al succionar o chasquidos mientras amamanta. ? Tiene dolor en los pezones.  Si el beb no est bien prendido, introduzca el Micron Technology del beb y el pezn. De este modo romper el sello. Luego, intente ayudar al beb a prenderse otra vez.  Si necesita ayuda, vea  a un especialista en lactancia. Comunquese con un mdico si:  Usted presenta agrietamiento o irritacin en los pezones durante ms de 1semana.  Tiene dolor en los pezones.  Tiene demasiada State Farm (congestin [place]) y no mejora despus de un lapso de 48 a 72 horas.  Tiene una obstruccin en un conducto galactforo y [place].  Sigue los consejos para lograr un buen agarre, pero necesita ms ayuda.  Observa que sale un lquido similar al pus por la mama.  Su beb no aumenta de peso.  Su beb pierde peso. Resumen  El agarre, o que el beb se prenda, se refiere a cmo la boca del beb se agarra al pezn al [employer].  Pruebe posiciones diferentes para Copy una que funcione tanto para usted como para el beb.  Si el beb no se prende bien, a usted se Associate Professor o Tenet Healthcare, o puede tener otros [place].  Trabaje con un especialista en lactancia (asesor en lactancia) para ayudar a que el beb se prenda correctamente. Esta informacin no tiene Theme park manager el consejo del mdico. Asegrese de hacerle al mdico cualquier pregunta que tenga. Document Revised: 12/04/2019 Document Reviewed: 12/04/2019 Elsevier Patient Education  2021 Elsevier Inc. Parto vaginal, cuidados de puerperio Postpartum Care After Vaginal Delivery La siguiente informacin ofrece una gua sobre cmo cuidarse desde el momento en que nazca su beb y Elaina Hoops 6 a 12 semanas despus del parto (perodo del posparto). Si tiene problemas o preguntas, pngase en contacto con su mdico para obtener instrucciones ms especficas. Siga estas instrucciones en su casa: Hemorragia vaginal  Es normal tener un poco de hemorragia vaginal (loquios) despus del parto. Use un apsito sanitario para el sangrado y secrecin. ? Durante la primera semana despus del parto, la cantidad y el aspecto de los loquios a menudo es similar a los del perodo menstrual. ? Durante las  siguientes semanas disminuir gradualmente hasta convertirse en una secrecin seca amarronada o [place]. ? En la Lennar Corporation, los loquios se detienen Guardian Life Insurance 4 a 6semanas despus del parto, pero esto puede variar.  Cambie los apsitos sanitarios con frecuencia. Observe si hay cambios en el flujo, como: ? Aumento repentino en el volumen. ? Cambio en el color. ? Cogulos de Temple-Inland.  Si expulsa un cogulo de sangre por la vagina, gurdelo y llame al mdico. No deseche los cogulos de sangre por el inodoro antes de hablar con su mdico.  No use tampones ni se haga duchas vaginales hasta que el mdico la autorice.  Si no est amamantando, volver a tener su perodo entre 6 y 8 semanas despus del parto. Si solamente alimenta al beb con Colgate Palmolive, podra no volver a tener su perodo hasta que deje de [place]. Cuidados perineales  Mantenga la zona entre la vagina y el ano (perineo) limpia y Magazine features editor. Utilice apsitos o Jacobs Engineering y  cremas, como se lo hayan indicado.  Si le hicieron un corte quirrgico en el perineo (episiotoma) o tuvo un desgarro, controle la zona para detectar signos de infeccin hasta que sane. Est atenta a los siguientes signos: ? Aumento del enrojecimiento, la hinchazn o Chief Technology Officer. ? Presenta lquido o sangre que supura del corte o Insurance account manager. ? [place]. ? Pus o mal olor.  Es posible que le den una botella rociadora para que use en lugar de limpiarse el rea con papel higinico despus de usar el bao. Seque la zona dando golpecitos suaves.  Para aliviar el dolor causado por una episiotoma, un desgarro o venas hinchadas en el ano (hemorroides), tome un bao de asiento tibio 2 o 3 veces por da. En un bao de asiento, el agua tibia solamente debe llegar Marsh & McLennan caderas y Tribune Company.   Cuidado de las [address]  En los [address] despus del parto, las mamas pueden sentirse pesadas, llenas e incmodas (congestin  [place]). Tambin puede escaparse leche de sus senos. Pdale al mdico que le sugiera formas de Acupuncturist.  Si est amamantando: ? Use un sostn que sujete las mamas y [place]. Utilice protectores mamarios para Insurance account manager Mirant se filtre. ? Mantenga los pezones secos y limpios. Aplique cremas y Energy Transfer Partners se lo hayan indicado. ? Puede tener contracciones uterinas cada vez que amamante durante varias semanas despus del [place]. Esto ayuda a que el tero vuelva a su tamao normal. ? Si tiene algn problema con la lactancia materna, infrmelo al mdico o a un Holiday representative.  Si no est amamantando: ? Evite tocarse las mamas. No extraiga (saque) Colgate Palmolive. Al hacerlo, podran producir ms leche. ? Use un sostn que le proporcione el ajuste correcto y compresas fras para reducir la hinchazn. Intimidad y sexualidad  Pregntele al mdico cundo puede retomar la actividad sexual. Esto puede depender de lo siguiente: ? Su riesgo de sufrir infecciones. ? La rapidez con la que est sanando. ? Su comodidad y deseo de retomar la actividad sexual.  Despus del parto, puede quedar embarazada incluso si no ha tenido todava su perodo. Hable con el mdico acerca de los mtodos de control de la natalidad (mtodos anticonceptivos) o planificacin familiar si desea Social research officer, government en el futuro. Medicamentos  Use los medicamentos de venta libre y los recetados solamente como se lo haya indicado el mdico.  Tome un laxante de venta libre para ayudar con las deposiciones como se lo haya indicado el mdico.  Si le recetaron un antibitico, tmelo como se lo haya indicado el mdico. No deje de tomar el antibitico aunque comience a sentirse mejor.  Revise todos los medicamentos con Engineer, civil (consulting) y actuales para comprobar la posible transferencia a la [place]. Actividad  Retome sus actividades normales de a poco segn lo indicado por el mdico.  Descanse todo lo que  pueda. Tome siestas mientras el beb duerme. Comida y bebida  Beba suficiente lquido como para Pharmacologist la orina de color amarillo plido.  Para ayudar a prevenir o Educational psychologist, coma alimentos ricos en Rohm [employer]as.  Elija una alimentacin saludable para ayudar a la lactancia o los objetivos de prdida de [place].  Tome sus vitaminas prenatales hasta que su mdico le indique que deje de [place].   Recomendaciones/consejos generales  No consuma ningn producto que contenga nicotina o tabaco. Estos productos incluyen cigarrillos, tabaco para Theatre manager y aparatos de vapeo, como los Administrator, Civil Service. Si necesita  ayuda para dejar de fumar, consulte al mdico.  No beba alcohol, especialmente si est amamantando.  No tome medicamentos o frmacos que no se le receten, especialmente si est en perodo de lactancia.  Visite al mdico para un control de posparto dentro de las primeras 3 a 6 semanas despus del [place].  Realice una visita posparto integral a ms tardar 12 semanas despus del parto.  Asista a todas las visitas de seguimiento para usted y su beb. Comunquese con un mdico si:  Siente tristeza o preocupacin de forma inusual.  Las mamas se ponen rojas, le duelen o se endurecen.  Tiene fiebre u otros signos de infeccin.  Tiene sangrado que est empapando una compresa por hora o tiene cogulos de [place].  Siente un dolor de cabeza intenso que no se [place] o tiene cambios en la visin.  Tiene nuseas y vmitos y no puede comer o beber nada durante 24horas. Solicite ayuda de inmediato si:  Tiene dolor en el pecho o dificultad para respirar.  Tiene un dolor repentino e intenso en la pierna.  Tiene una convulsin o se desmaya.  Tiene pensamientos acerca de lastimarse a usted misma o a su beb. Si alguna vez siente que puede lastimarse o Physicist, medical a Economist, o tiene pensamientos de poner fin a su vida, busque ayuda de inmediato. Dirjase al  servicio de urgencias ms cercano o:  Comunquese con el servicio de emergencias de su localidad (911 en los [place]).  National Suicide Prevention Lifeline (Lnea Telefnica Nacional para la Prevencin del Suicidio) al (705)563-2749. Esta lnea de asistencia al suicida est abierta las 24 horas del da.  Enve un mensaje de texto a la lnea para casos de crisis al [phone] (en los EE.UU.). Resumen  El perodo de tiempo despus el parto y Elaina Hoops 6 a 12 semanas despus del parto se denomina perodo posparto.  Asista a todas las visitas de seguimiento para usted y su beb.  Revise todos los medicamentos con Engineer, civil (consulting) y actuales para comprobar la posible transferencia a la [place].  Pngase en contacto con un mdico si se siente inusualmente triste o preocupada durante el perodo posparto. Esta informacin no tiene Theme park manager el consejo del mdico. Asegrese de hacerle al mdico cualquier pregunta que tenga. Document Revised: 03/05/2020 Document Reviewed: 02/11/2020 Elsevier Patient Education  2021 ArvinMeritor.

## 2020-06-22 NOTE — Progress Notes (Signed)
Pt discharged with infant.  Discharge instructions, prescriptions and follow up appointment given to and reviewed with pt via interpreter. Pt verbalized understanding. Escorted out by auxillary. 

## 2020-06-25 ENCOUNTER — Other Ambulatory Visit: Payer: Self-pay

## 2020-07-29 ENCOUNTER — Telehealth: Payer: Self-pay

## 2020-07-29 NOTE — Telephone Encounter (Signed)
TC to patient regarding her PP visit on 08/13/2020 at [facility]. Per Tresa Endo at [facility], patient will need to bring $309.50 with her for that appointment. [facility] has not been able to contact patient to inform her. Patient can choose whether to go to [facility] with cash, or to cancel that appointment and come to [facility] for PP visit. Patient is a 0% pay at [facility]. Patient will need the [facility] financial #s if she comes for [facility] as she may have hospital bills that she needs to make payment plan for. Unable to LM as patient phone not taking messages. TC to emergency contact and LM to have patient call asap. Interpreter M. Yemen.Burt Knack, RN

## 2020-07-30 NOTE — Telephone Encounter (Addendum)
Phone call to [phone], "can not accept calls at this time." Could not leave message.  Phone call with interpreter Salli Real: Phone call to pt's contact (spouse) at [phone]. Left message that we are trying to get in touch with Ana Ali about the pricing for her scheduled PP visit. Please have [name] call [facility] for details.

## 2020-08-03 ENCOUNTER — Telehealth: Payer: Self-pay | Admitting: Student

## 2020-08-03 NOTE — Telephone Encounter (Signed)
See new phone note started 08/03/20; pt states she wants PP appt with [facility].

## 2020-08-03 NOTE — Telephone Encounter (Signed)
LM with patient. In response to TC from patient stating that she wants to schedule postpartum. Sharlyne Pacas, RN

## 2020-08-03 NOTE — Telephone Encounter (Signed)
Phone call to pt at [phone] with interpreter Christ Kick. Left detailed message about the PP charge at [facility] vs 0% pay at [facility], if she decides to schedule PP appt with [facility] please call [phone].

## 2020-08-04 NOTE — Telephone Encounter (Signed)
Per staff message received from [facility] Coordinator, client desires pp at [facility] and not [facility] due to cost. Call to client with Marlene Yemen and left message to call and schedule pp appt at [facility]. Number to call provided. Call to emergency contact and requested assistance contacting client to schedule pp appt. Number to call provided. Jossie Ng, RN

## 2020-08-05 NOTE — Telephone Encounter (Signed)
Phone call to pt with Language Line interpreter. Left message on voicemail that [facility] is calling to set up PP appt. Please call [phone] to schedule PP appt with [facility].

## 2020-08-06 ENCOUNTER — Telehealth: Payer: Self-pay | Admitting: Family Medicine

## 2020-08-06 NOTE — Telephone Encounter (Signed)
Phone call to pt with interpreter Marlene Yemen. Left message on voicemail that RN with [facility] is calling to set up PP appt. Please call [phone] to make an appt.

## 2020-08-06 NOTE — Telephone Encounter (Signed)
Pt called wanting to speak with nurse. Needs clarification on upcoming appointment for post partum. Provided a different number to be reached at since her current number is unavailable

## 2020-08-07 NOTE — Telephone Encounter (Signed)
See new phone note started 08/06/20.

## 2020-08-07 NOTE — Telephone Encounter (Signed)
Reference older phone note started 08/03/20.  Phone call to pt with interpreter from [employer]. Best phone number to reach her at is [phone]. Pt states she desires to have PP at [facility] and desires to have Nexplanon for birth control. Pt scheduled for 08/12/20 for PP and nexplanon inserton. Pt states she will call [facility] to cancel her PP with them.

## 2020-08-12 ENCOUNTER — Ambulatory Visit: Payer: Self-pay | Admitting: Family Medicine

## 2020-08-12 ENCOUNTER — Other Ambulatory Visit: Payer: Self-pay

## 2020-08-12 ENCOUNTER — Encounter: Payer: Self-pay | Admitting: Family Medicine

## 2020-08-12 DIAGNOSIS — Z113 Encounter for screening for infections with a predominantly sexual mode of transmission: Secondary | ICD-10-CM

## 2020-08-12 DIAGNOSIS — Z3009 Encounter for other general counseling and advice on contraception: Secondary | ICD-10-CM

## 2020-08-12 DIAGNOSIS — Z30017 Encounter for initial prescription of implantable subdermal contraceptive: Secondary | ICD-10-CM

## 2020-08-12 LAB — URINALYSIS
Bilirubin, UA: NEGATIVE
Glucose, UA: NEGATIVE
Ketones, UA: NEGATIVE
Leukocytes,UA: NEGATIVE
Nitrite, UA: NEGATIVE
Protein,UA: NEGATIVE
RBC, UA: NEGATIVE
Specific Gravity, UA: 1.025 (ref 1.005–1.030)
Urobilinogen, Ur: 0.2 mg/dL (ref 0.2–1.0)
pH, UA: 7 (ref 5.0–7.5)

## 2020-08-12 LAB — WET PREP FOR TRICH, YEAST, CLUE
Trichomonas Exam: NEGATIVE
Yeast Exam: NEGATIVE

## 2020-08-12 LAB — HEMOGLOBIN, FINGERSTICK: Hemoglobin: 12.9 g/dL (ref 11.1–15.9)

## 2020-08-12 MED ORDER — ETONOGESTREL 68 MG ~~LOC~~ IMPL
68.0000 mg | DRUG_IMPLANT | Freq: Once | SUBCUTANEOUS | Status: AC
  Administered 2020-08-12: 68 mg via SUBCUTANEOUS

## 2020-08-12 NOTE — Progress Notes (Addendum)
Here today for PP check up and Nexplanon Insertion. NSVD of female 06/21/2020 at [facility]. Wants Nexplanon for birth control. Nexplanon consult completed. Wants all STD screening today including bloodwork. Tawny Hopping, RN

## 2020-08-12 NOTE — Progress Notes (Signed)
[place]et [place], Hgb and UA results reviewed. Per standing orders no treatment indicated. 2 week BP recheck scheduled for 08/26/2020 per provider orders. Tawny Hopping, RN

## 2020-08-12 NOTE — Progress Notes (Signed)
Post Partum Exam  Ana Ali is a 45 y.o. [phone] female who presents for a postpartum visit. She is 7 week postpartum following a spontaneous vaginal delivery. I have fully reviewed the prenatal and intrapartum course. The delivery was at 38 gestational weeks.  Anesthesia: epidural. Postpartum course has been going well. Baby's course has been adjusting well . Baby is feeding by breast and formula  Reports  no bleeding and some spottng . Bowel function is normal. Bladder function is normal. Patient is sexually active. Contraception method is abstinence.   Postpartum depression screening:  Edinburgh Postnatal Depression Scale - 08/12/20 1414      Edinburgh Postnatal Depression Scale:  In the Past 7 Days   I have been able to laugh and see the funny side of things. 0    I have looked forward with enjoyment to things. 0    I have blamed myself unnecessarily when things went wrong. 0    I have been anxious or worried for no good reason. 2    I have felt scared or panicky for no good reason. 2    Things have been getting on top of me. 0    I have been so unhappy that I have had difficulty sleeping. 0    I have felt sad or miserable. 0    I have been so unhappy that I have been crying. 1    The thought of harming myself has occurred to me. 0    Edinburgh Postnatal Depression Scale Total 5            The following portions of the patient's history were reviewed and updated as appropriate: allergies, current medications, past family history, past medical history, past social history, past surgical history and problem list. Last pap smear done 11/14/2018 and was NIL, - HPV   Review of Systems A comprehensive review of systems was negative.    Objective:  BP (!) 143/87   Ht 5\' 5"  (1.651 m)   Wt 218 lb 3.2 oz (99 kg)   LMP 09/24/2019 (Exact Date)   Breastfeeding Yes   BMI 36.31 kg/m   Gen: well appearing, NAD HEENT: no scleral icterus CV: RR Lung: Normal  WOB Breast:performed-yes , no tenderness, full, patient breast feeding during appointment, no trouble with feedings.  Ext: warm well perfused  GU:  External genitalia without, erythema, edema , lesions or inguinal adenopathy. No signs of lacerations.   Vagina with normal mucosa and discharge and pH equals 4.  Cervix without visual lesions, uterus firm, mobile, non-tender, no masses.    Rectal: performed -  not indicated       Assessment:     Postpartum exam. Pap smear not done at today's visit.   Plan:   Essential components of care per ACOG recommendations for Comprehensive Postpartum exam:  1.  Mood and well being: Patient with 5 on  depression screening today. Reviewed local resources for support. EPDS is low risk. Reviewed resources and that mood sx in first year after pregnancy are considered related to pregnancy and to reach out for help at [facility] if needed. Discussed [facility] as link to care and availability of LCSW for counseling.  Patient declined.   - Patient does not use tobacco. - hx of drug use? No   2. Infant care and feeding:  -Patient currently breastmilk feeding? Yes , Patient does not currently.  Reviewed importance of draining breast regularly to support lactation.   -Recommended patient engage  with WIC/BFpeer counselors  -Counseled to sign new child up for [facility] services -Social determinants of health (SDOH) reviewed in EPIC. No concerns. The following needs were identified transportation.  Will reach out to Harrie Jeans, of [facility] to connect for services.      3. Sexuality, contraception and birth spacing  Contraception: Contraception counseling: Reviewed all forms of birth control options in the tiered based approach. available including abstinence; over the counter/barrier methods; hormonal contraceptive medication including pill, patch, ring, injection,contraceptive implant; hormonal and nonhormonal IUDs; permanent sterilization options including vasectomy and the various  tubal sterilization modalities. Risks, benefits, and typical effectiveness rates were reviewed.  Questions were answered.  Written information was also given to the patient to review.  Patient desires nexplanon, this was prescribed for patient. She will follow up in for surveillance.  She was told to call with any further questions, or with any concerns about this method of contraception.  Emphasized use of condoms 100% of the time for STI prevention.  Patient was not offered ECP, d/t patient has not had sex since 05/2020 before delivery.    - Patient does not want a pregnancy in the next year.  Desired family size is 4 children.  - Reviewed forms of contraception in tiered fashion. Patient desired nexplanon  today.   - Discussed birth spacing of 18 months.  Patient reports that she doe not want any more children.    4. Sleep and fatigue -Encouraged family/partner/community support of 4 hrs of uninterrupted sleep to help with mood and fatigue  5. Physical Recovery  - Discussed patients delivery and no complications - Patient had a 0 degree laceration, perineal healing reviewed.  - Patient has urinary incontinence? No Patient was referred to pelvic floor PT  - Patient is safe to resume physical and sexual activity.   6.  Health Maintenance/Chronic Disease - Last pap smear performed 11/14/2018 and was normal with negative HPV. Mammogram  1. Postpartum exam Blood pressure was elevated at beginning of visit.  Blood pressure taking before nexplanon insertion, b/p was 137/90.  Urine dip collected today.    Patient to have B/P check in 2 weeks.   - Hemoglobin, venipuncture - Urinalysis (Urine Dip)  2. Family planning counseling 3. Nexplanon insertion Nexplanon Insertion Procedure Patient identified, informed consent performed, consent signed.   Patient does understand that irregular bleeding is a very common side effect of this medication. She was advised to have backup contraception after  placement. Patient was determined to meet WHO criteria for not being pregnant. Appropriate time out taken.  The insertion site was identified 8-10 cm (3-4 inches) from the medial epicondyle of the humerus and 3-5 cm (1.25-2 inches) posterior to (below) the sulcus (groove) between the biceps and triceps muscles of the patient's left arm and marked.  Patient was prepped with alcohol swab and then injected with 3 ml of 1% lidocaine.  Arm was prepped with chlorhexidene, Nexplanon removed from packaging,  Device confirmed in needle, then inserted full length of needle and withdrawn per handbook instructions. Nexplanon was able to palpated in the patient's arm; patient palpated the insert herself. There was minimal blood loss.  Patient insertion site covered with guaze and a pressure bandage to reduce any bruising.  The patient tolerated the procedure well and was given post procedure instructions.   4. Screening examination for venereal disease Patient reports that she wants STI testing, Last sex was 06/12/2020, denies s/sx. States that she only has one partner but  "can never  be too sure"   Screenings completed today.    - Chlamydia/Gonorrhea [facility] - WET PREP FOR TRICH, YEAST, CLUE  Patient given handout about PCP care in the community Given MVI per family planning program guidelines and availability  Follow up in: 2 weeks or as needed.   M. Yemen used for Spanish interpretation.   Wendi Snipes, FNP

## 2020-08-18 LAB — HM HIV SCREENING LAB: HM HIV Screening: NEGATIVE

## 2020-08-24 ENCOUNTER — Telehealth: Payer: Self-pay | Admitting: Family Medicine

## 2020-08-24 NOTE — Telephone Encounter (Signed)
Pt has a question regarding an over the counter medication she is taking and it's safety for breast feeding. Pt states that her baby's pediatrician's office told her to call the provider at the health department.

## 2020-08-24 NOTE — Telephone Encounter (Signed)
TC to patient to let her know that Dr. Alvester Morin states it's ok to continue with breastfeeding at this time. LM with number to call.Burt Knack, RN

## 2020-08-25 NOTE — Telephone Encounter (Signed)
Call to client to counsel okay to resume breastfeeding per Dr. Alvester Morin (following client self dosing with Nitazoxanida), Per client, resumed breastfeeding this am. Salli Real interpreted during call. Jossie Ng, RN

## 2020-08-26 ENCOUNTER — Ambulatory Visit: Payer: Self-pay

## 2020-09-01 ENCOUNTER — Ambulatory Visit: Payer: Self-pay

## 2020-09-04 NOTE — Addendum Note (Signed)
Addended by: Wendi Snipes on: 09/04/2020 08:35 AM   Modules accepted: Orders

## 2020-09-04 NOTE — Addendum Note (Signed)
Addended by: Wendi Snipes on: 09/04/2020 08:53 AM   Modules accepted: Orders

## 2020-09-07 ENCOUNTER — Ambulatory Visit: Payer: Self-pay

## 2020-10-16 IMAGING — US US MFM OB DETAIL+14 WK
1 series · 13 of 28 positions shown · non-contrast
Comparison: none

[Series 1: us mfm ob detail+14 wk · 13 of 85 slices shown]
[im 4/85]
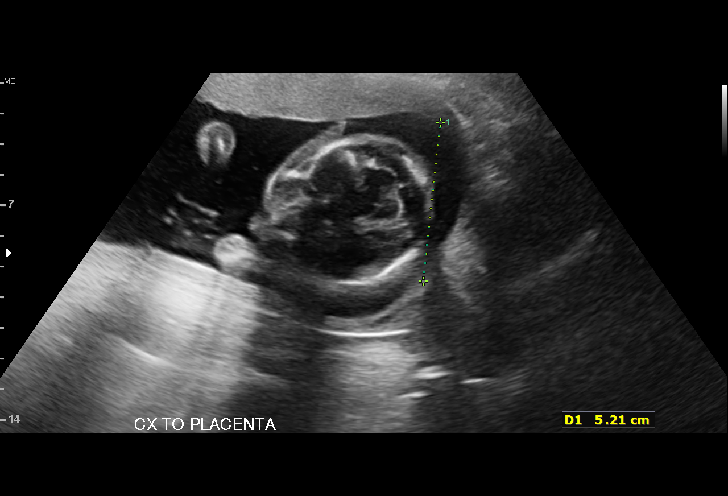
[im 10/85]
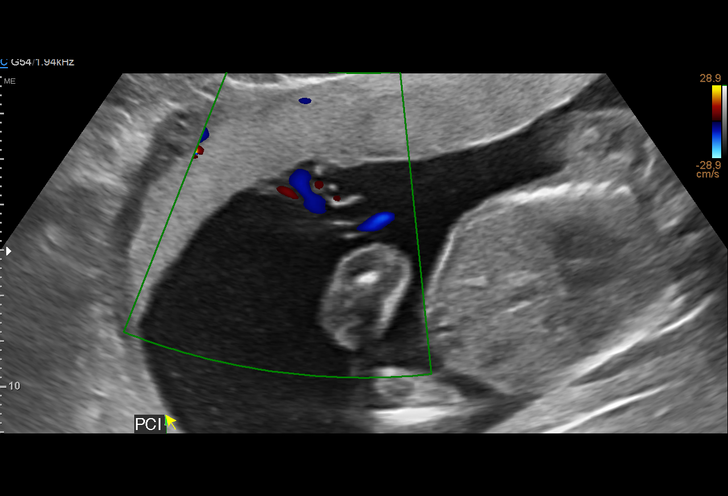
[im 16/85]
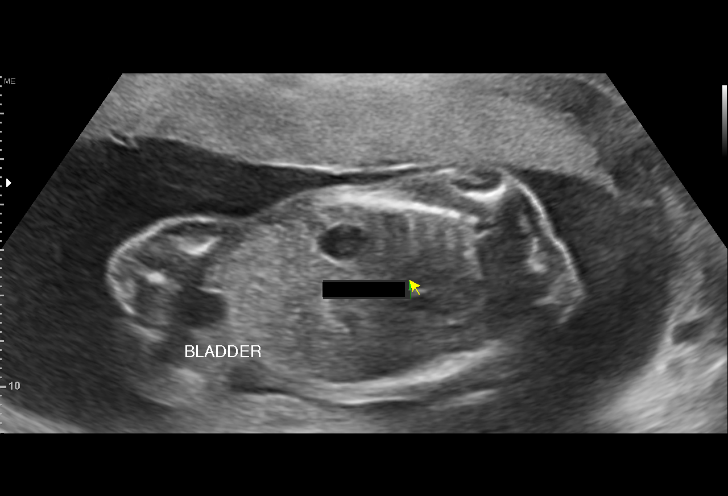
[im 22/85]
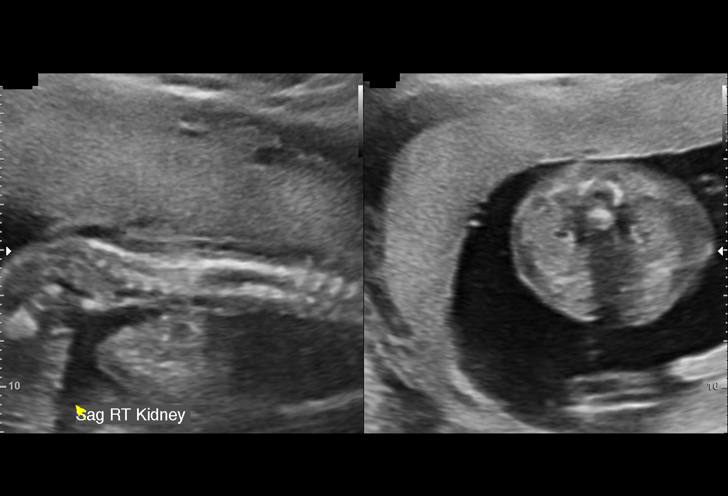
[im 29/85]
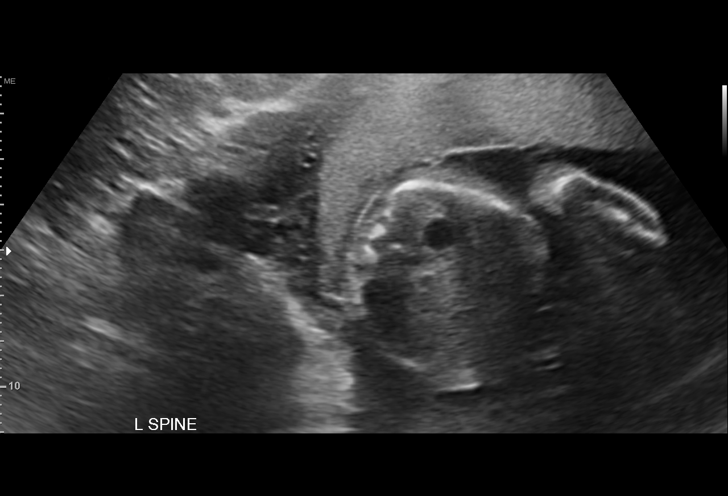
[im 35/85]
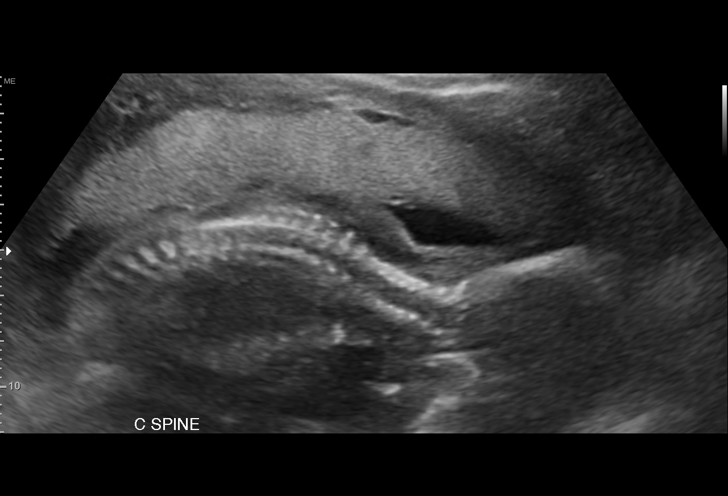
[im 44/85]
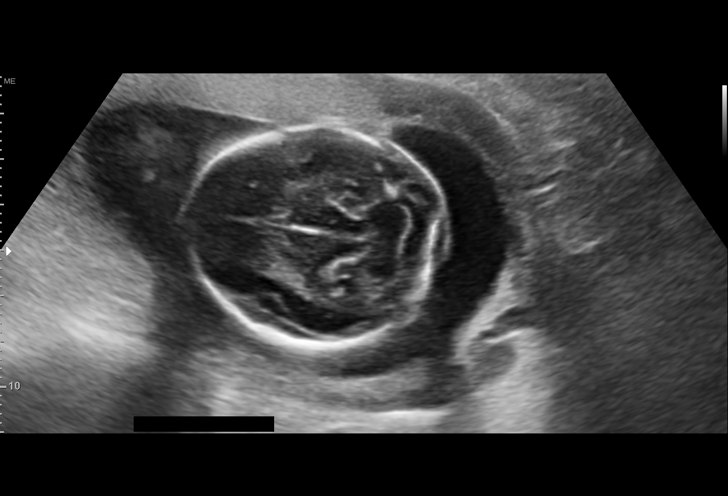
[im 50/85]
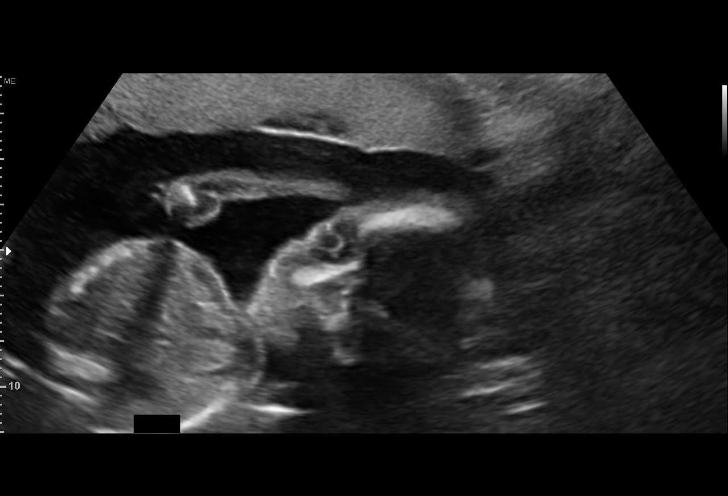
[im 57/85]
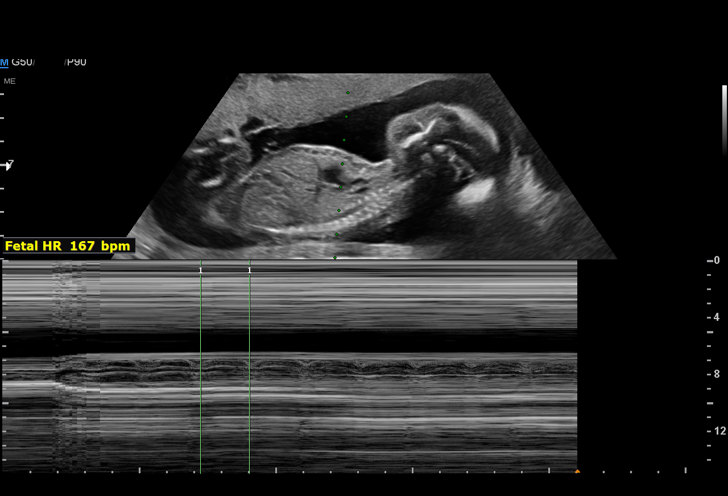
[im 63/85]
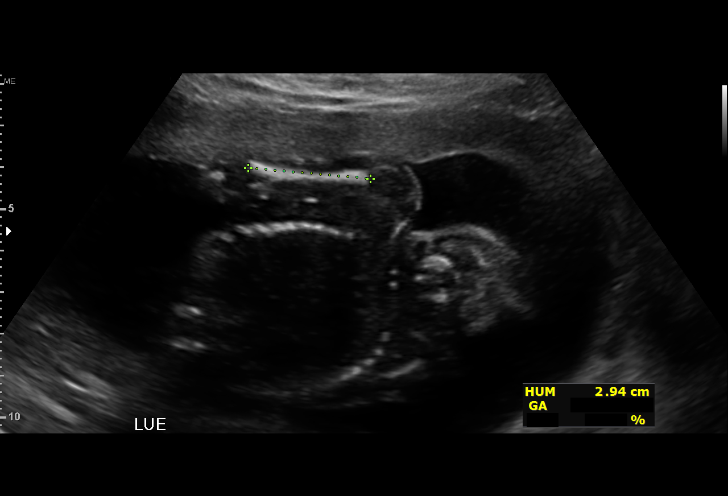
[im 69/85]
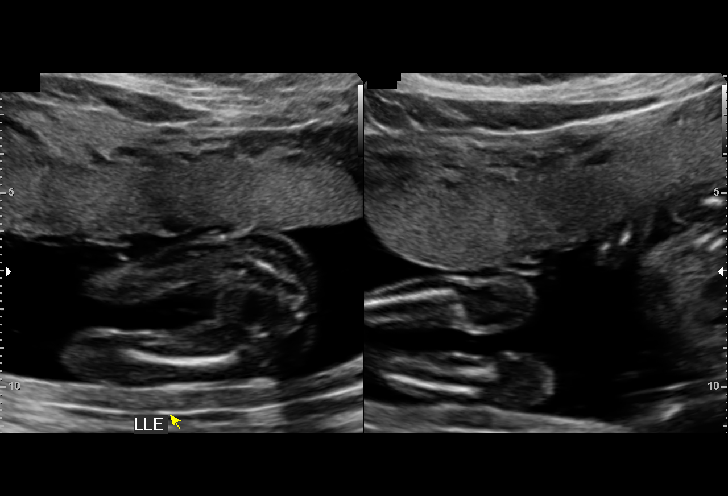
[im 75/85]
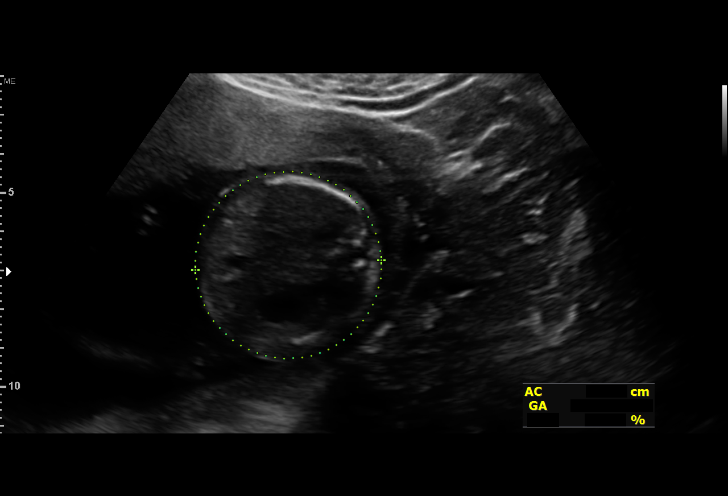
[im 81/85]
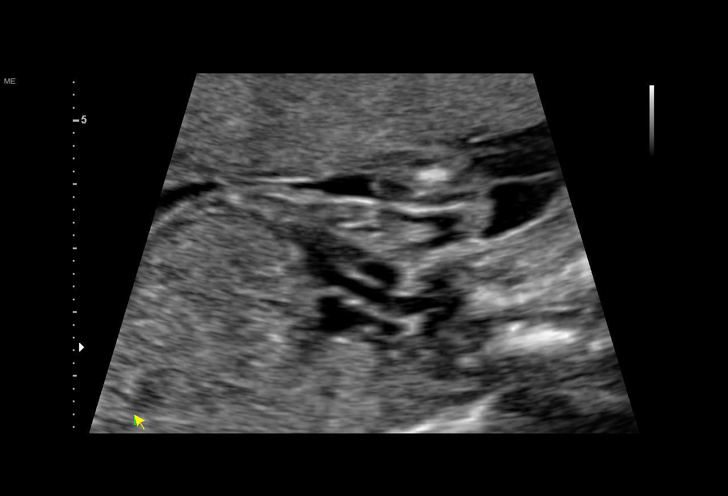

[13 of 28 positions shown; findings below may reference images not displayed]

[facility]

                                                             Maternal [HOSPITAL]
                                                             at [REDACTED]
                   SHERPA CNM

Indications

 19 weeks gestation of pregnancy
 Advanced maternal age multigravida 35+,
 second trimester
 Obesity complicating pregnancy
Fetal Evaluation

 Num Of Fetuses:          1
 Fetal Heart              167
 Rate(bpm):
 Cardiac Activity:        Observed
 Presentation:            Cephalic
 Placenta:                Anterior
 P. Cord Insertion:       Visualized, central

                             Largest Pocket(cm)
                             5
Biometry

 BPD:      47.4  mm     G. Age:  20w 2d         88  %    CI:         78.43  %    70 - 86
                                                         FL/HC:       19.0  %    16.1 -
 HC:      169.3  mm     G. Age:  19w 4d         56  %    HC/AC:       1.11       1.09 -
 AC:        152  mm     G. Age:  20w 3d         81  %    FL/BPD:      67.9  %
 FL:       32.2  mm     G. Age:  20w 0d         70  %    FL/AC:       21.2  %    20 - 24
 HUM:      29.7  mm     G. Age:  19w 5d         63  %
 CER:      18.8  mm     G. Age:  18w 3d         15  %
 NFT:       3.5  mm

 LV:        5.9  mm
 CM:        3.9  mm

 Est. FW:     337   g    0 lb 12 oz      91  %
                    m
Gestational Age

 LMP:           19w 2d        Date:  09/24/19                 EDD:    06/30/20
 U/S Today:     20w 1d                                        EDD:    06/24/20
 Best:          19w 2d     Det. By:  LMP  (09/24/19)          EDD:    06/30/20
Anatomy

 Cranium:               Appears normal         Aortic Arch:            Appears normal
 Cavum:                 Appears normal         Ductal Arch:            Appears normal
 Ventricles:            Appears normal         Diaphragm:              Appears normal
 Choroid Plexus:        Appears normal         Stomach:                Appears normal,
                                                                       left sided
 Cerebellum:            Appears normal         Abdomen:                Appears normal
 Posterior Fossa:       Appears normal         Abdominal Wall:         Appears nml (cord
                                                                       insert, abd wall)
 Nuchal Fold:           Appears normal         Cord Vessels:           Appears normal (3
                                                                       vessel cord)
 Face:                  Appears normal         Kidneys:                Appear normal
                        (orbits and profile)
 Lips:                  Appears normal         Bladder:                Appears normal
 Thoracic:              Appears normal         Spine:                  Appears normal
 Heart:                 Appears normal         Upper Extremities:      Appears normal
                        (4CH, axis, and
                        situs)
 RVOT:                  Appears normal         Lower Extremities:      Appears normal
 LVOT:                  Appears normal
Cervix Uterus Adnexa

 Cervix
 Length:           3.81  cm.
Impression

 Single intrauterine pregnancy here for a detailed anatomy
 advanced maternal age of 43 yo
 Normal anatomy with measurements consistent with dates
 There is good fetal movement and amniotic fluid volume

 I reviewed today's ultrasound which was normal. We
 discussed the increased risk for fetal growth restriction,
 preeclampsia, gestational diabetes and stillbirth. Therefore
 we recommend serial growth exams every 4 weeks
 beginning at 28 weeks. Initiate weekly testing at 36 weeks
 with delivery by 39-40 weeks if uncomplicated and no fetal
 growth delays.

 Ms. Efflam has a normal AFP
Recommendations

 Follow up growth scheduled at 28 weeks.

## 2020-12-02 ENCOUNTER — Ambulatory Visit (LOCAL_COMMUNITY_HEALTH_CENTER): Payer: Self-pay | Admitting: Advanced Practice Midwife

## 2020-12-02 ENCOUNTER — Other Ambulatory Visit: Payer: Self-pay

## 2020-12-02 VITALS — BP 130/85 | Ht 63.0 in | Wt 218.8 lb

## 2020-12-02 DIAGNOSIS — Z3046 Encounter for surveillance of implantable subdermal contraceptive: Secondary | ICD-10-CM

## 2020-12-02 NOTE — Progress Notes (Signed)
Pt here for Nexplanon removal.  Pt states that she doesn't have a period and it makes her gain weight.  Pt given condoms. Berdie Ogren, RN

## 2020-12-02 NOTE — Progress Notes (Addendum)
Contraception/Family Planning VISIT ENCOUNTER NOTE  Subjective:   Ana Ali is a 45 y.o. MHF nonsmoker G5P4014 (26, 23, 14, 5 months) female here for reproductive life counseling.  Desires Nexplanon removed and condoms only for  [facility].  Reports she does not want a pregnancy in the next year. Denies abnormal vaginal bleeding, discharge, pelvic pain, problems with intercourse or other gynecologic concerns. Pt states unhappy with Nexplanon inserted 08/12/20 at pp exam because of increased weight gain and low libido (for which her husband told her to get it removed because "I'm no good that way"). Last sex 11/24/20 without condom; with current partner x 28 years; 1 partner in last 3 mo. Breastfeeding 12 month old baby. Pt doesn't want a pregnancy.  Counseled pt on increased risk of pregnancy with condoms only and preconceptual counseling done including increased risk of Down's syndrome after age 37. BP 130/85. Pt insists on having Nexplanon removed today. Weight today=218 lbs. Last pap 11/14/18 neg HPV neg. Not working.   Gynecologic History No LMP recorded. Patient has had an implant. Contraception: Nexplanon  Health Maintenance Due  Topic Date Due   COVID-19 Vaccine (1) Never done   Pneumococcal Vaccine 19-66 Years old (1 - PCV) Never done     The following portions of the patient's history were reviewed and updated as appropriate: allergies, current medications, past family history, past medical history, past social history, past surgical history and problem list.  Review of Systems Pertinent items are noted in HPI.   Objective:  BP 130/85   Ht 5\' 3"  (1.6 m)   Wt 218 lb 12.8 oz (99.2 kg)   Breastfeeding Yes   BMI 38.76 kg/m  Gen: well appearing, NAD HEENT: no scleral icterus CV: RR Lung: Normal WOB Ext: warm well perfused    Assessment and Plan:   Contraception counseling: Reviewed all forms of birth control options in the tiered based approach. available including  abstinence; over the counter/barrier methods; hormonal contraceptive medication including pill, patch, ring, injection,contraceptive implant, ECP; hormonal and nonhormonal IUDs; permanent sterilization options including vasectomy and the various tubal sterilization modalities. Risks, benefits, and typical effectiveness rates were reviewed.  Questions were answered.  Written information was also given to the patient to review.  Patient desires condoms this was prescribed for patient. She will follow up in  prn for surveillance.  She was told to call with any further questions, or with any concerns about this method of contraception.  Emphasized use of condoms 100% of the time for STI prevention.  Patient was offered ECP. ECP was not accepted by the patient. ECP counseling was not given - see RN documentation  1. Nexplanon removal Nexplanon Removal Patient identified, informed consent performed, consent signed.   Appropriate time out taken. Nexplanon site identified.  Area prepped in usual sterile fashon. 3 ml of 1% lidocaine with Epinephrine was used to anesthetize the area at the distal end of the implant and along implant site. A small stab incision was made right beside the implant on the distal portion.  The Nexplanon rod was grasped using straight hemostats/manual and removed without difficulty.  There was minimal blood loss. There were no complications.  Steri-strips were applied over the small incision.  A pressure bandage was applied to reduce any bruising.  The patient tolerated the procedure well and was given post procedure instructions.   Nexplanon:   Counseled patient to take OTC analgesic starting as soon as lidocaine starts to wear off and take regularly for  at least 48 hr to decrease discomfort.  Specifically to take with food or milk to decrease stomach upset and for IB 600 mg (3 tablets) every 6 hrs; IB 800 mg (4 tablets) every 8 hrs; or Aleve 2 tablets every 12 hrs.     2. Morbid  obesity (HCC) 218 lbs  Please give pt primary care  MD list; referred pt due to BP today Please give pt condoms     Please refer to After Visit Summary for other counseling recommendations.   No follow-ups on file.  Alberteen Spindle, CNM [facility] DEPARTMENT

## 2021-02-11 ENCOUNTER — Encounter: Payer: Self-pay | Admitting: General Surgery

## 2021-05-20 ENCOUNTER — Ambulatory Visit (LOCAL_COMMUNITY_HEALTH_CENTER): Payer: Self-pay | Admitting: Advanced Practice Midwife

## 2021-05-20 ENCOUNTER — Other Ambulatory Visit: Payer: Self-pay

## 2021-05-20 VITALS — BP 114/73 | HR 78 | Temp 99.7°F | Resp 16 | Ht 65.0 in | Wt 209.0 lb

## 2021-05-20 DIAGNOSIS — Z01419 Encounter for gynecological examination (general) (routine) without abnormal findings: Secondary | ICD-10-CM

## 2021-05-20 LAB — WET PREP FOR TRICH, YEAST, CLUE
Trichomonas Exam: NEGATIVE
Yeast Exam: NEGATIVE

## 2021-05-20 NOTE — Progress Notes (Signed)
Patient is here for acute family planning visit.   Patient is complaining of feeling something out her vaginal area after she wipes after urinating or feels it come down after coughing or picking up 32 month old daughter. With her finger patient states she can put it back in place. However she states she worries her because she thinks this can given her an infection. This started around 1.5 months ago. Patient also reports pain with intercourse.   Annual exam: 08/12/20 LMP: 05/09/21 Last sex 05/17/21 (condoms every time and condoms given).   Patient continues to breastfeed.   Floy Sabina, RN

## 2021-05-20 NOTE — Progress Notes (Signed)
° °  [facility] problem visit  [facility]  Subjective:  Ana Ali is a 46 y.o. MHF nonsmoker G5P4 (11 mo, 14, 24, 27) breastfeeding being seen today for c/o "uterus is out of place" x 1.5 mo and feels something in her vagina with coughing or sneezing. She can push it back inside with her finger but it comes back out with valsalva. C/o painful intercourse now and urinary incontinence with valsalva.  Nexplanon inserted pp 08/12/20 and removed 12/02/20 due to c/o low libido and weight gain. Last sex 05/17/21 with condom; with current partner x 28 years. Only wants condoms for birth control.  Last PE 08/12/20. LMP 05/09/21  Chief Complaint  Patient presents with   Follow-up    HPI   Does the patient have a current or past history of drug use? No   No components found for: HCV]   Health Maintenance Due  Topic Date Due   COVID-19 Vaccine (1) Never done   Pneumococcal Vaccine 97-68 Years old (1 - PCV) Never done   INFLUENZA VACCINE  12/14/2020    ROS  The following portions of the patient's history were reviewed and updated as appropriate: allergies, current medications, past family history, past medical history, past social history, past surgical history and problem list. Problem list updated.   See flowsheet for other program required questions.  Objective:   Vitals:   05/20/21 1049  BP: 114/73  Pulse: 78  Resp: 16  Temp: 99.7 F (37.6 C)  TempSrc: Oral  Weight: 209 lb (94.8 kg)  Height: 5\' 5"  (1.651 m)    Physical Exam Vitals and nursing note reviewed.  Constitutional:      Appearance: Normal appearance. She is obese.  HENT:     Head: Normocephalic.  Eyes:     Conjunctiva/sclera: Conjunctivae normal.  Pulmonary:     Effort: Pulmonary effort is normal.  Abdominal:     Palpations: Abdomen is soft.     Comments: Poor tone, increased adipose, soft without masses or tenderness  Genitourinary:    Exam position:  Lithotomy position.     Urethra: Prolapse (with valsalva; +urinary incontence witnessed with valsalva) present.     Vagina: Vaginal discharge (small amt white creamy leukorrhea,ph<4.5) and prolapsed vaginal walls (left wall of vagina prolapses with valsalva to introitus but inverts spontaneously) present.     Cervix: Normal.     Uterus: Normal.      Adnexa: Right adnexa normal and left adnexa normal.     Rectum: Normal.  Musculoskeletal:        General: Normal range of motion.  Skin:    General: Skin is warm and dry.  Neurological:     Mental Status: She is alert. Mental status is at baseline.      Assessment and Plan:  Ana Ali is a 47 y.o. female presenting to the [facility] Department for a Women's Health problem visit  1. Well woman exam with routine gynecological exam Treat wet mount per standing orders Immunization nurse consult Counseled on how to do kegal exercises and handout given and to do 100x/day If desires pessary then told she will need to make apt with OB/GYN for that Pt counseled she is 46 yo, obese, G5P4, with hx of 9#8 baby and vaginal prolapse    Return in about 2 months (around 07/18/2021) for yearly physical exam.  No future appointments.  09/17/2021, CNM

## 2021-05-20 NOTE — Progress Notes (Signed)
Wet mount reviewed during clinic visit - no treatment indicated.   Pelvic floor exercises and Kegel exercises in spanish given to patient to try at home. If not improved list of OB/GYNs in [place] given to patient for further evaluation.   Floy Sabina, RN

## 2022-01-22 ENCOUNTER — Emergency Department
Admission: EM | Admit: 2022-01-22 | Discharge: 2022-01-22 | Disposition: A | Discharge status: Home / Self Care | Payer: Self-pay | Attending: Emergency Medicine | Admitting: Emergency Medicine

## 2022-01-22 DIAGNOSIS — O200 Threatened abortion: Secondary | ICD-10-CM | POA: Insufficient documentation

## 2022-01-22 DIAGNOSIS — Z3A00 Weeks of gestation of pregnancy not specified: Secondary | ICD-10-CM | POA: Insufficient documentation

## 2022-01-22 LAB — COMPREHENSIVE METABOLIC PANEL
ALT: 17 U/L (ref 0–55)
AST (SGOT): 20 U/L (ref 10–42)
Albumin/Globulin Ratio: 1.18 Ratio (ref 0.80–2.00)
Albumin: 4 gm/dL (ref 3.5–5.0)
Alkaline Phosphatase: 63 U/L (ref 40–145)
Anion Gap: 12.8 mMol/L (ref 7.0–18.0)
BUN / Creatinine Ratio: 9.6 Ratio — ABNORMAL LOW (ref 10.0–30.0)
BUN: 7 mg/dL (ref 7–22)
Bilirubin, Total: 0.2 mg/dL (ref 0.1–1.2)
CO2: 24 mMol/L (ref 20–30)
Calcium: 9.5 mg/dL (ref 8.5–10.5)
Chloride: 108 mMol/L (ref 98–110)
Creatinine: 0.73 mg/dL (ref 0.60–1.20)
EGFR: 104 mL/min/{1.73_m2} (ref 60–150)
Globulin: 3.4 gm/dL (ref 2.0–4.0)
Glucose: 86 mg/dL (ref 71–99)
Osmolality Calculated: 279 mOsm/kg (ref 275–300)
Potassium: 3.8 mMol/L (ref 3.5–5.3)
Protein, Total: 7.4 gm/dL (ref 6.0–8.3)
Sodium: 141 mMol/L (ref 136–147)

## 2022-01-22 LAB — CBC AND DIFFERENTIAL
Basophils %: 0.7 % (ref 0.0–3.0)
Basophils Absolute: 0.1 10*3/uL (ref 0.0–0.3)
Eosinophils %: 1.1 % (ref 0.0–7.0)
Eosinophils Absolute: 0.1 10*3/uL (ref 0.0–0.8)
Hematocrit: 38.1 % (ref 36.0–48.0)
Hemoglobin: 12.6 gm/dL (ref 12.0–16.0)
Lymphocytes Absolute: 3 10*3/uL (ref 0.6–5.1)
Lymphocytes: 31.9 % (ref 15.0–46.0)
MCH: 31 pg (ref 28–35)
MCHC: 33 gm/dL (ref 32–36)
MCV: 92 fL (ref 80–100)
MPV: 8.4 fL (ref 6.0–10.0)
Monocytes Absolute: 0.6 10*3/uL (ref 0.1–1.7)
Monocytes: 6 % (ref 3.0–15.0)
Neutrophils %: 60.2 % (ref 42.0–78.0)
Neutrophils Absolute: 5.6 10*3/uL (ref 1.7–8.6)
PLT CT: 285 10*3/uL (ref 130–440)
RBC: 4.14 10*6/uL (ref 3.80–5.00)
RDW: 11.3 % (ref 11.0–14.0)
WBC: 9.3 10*3/uL (ref 4.0–11.0)

## 2022-01-22 LAB — VH URINALYSIS WITH MICROSCOPIC AND CULTURE IF INDICATED
Bilirubin, UA: NEGATIVE
Glucose, UA: NEGATIVE mg/dL
Ketones UA: NEGATIVE mg/dL
Leukocyte Esterase, UA: 500 Leu/uL — AB
Nitrite, UA: NEGATIVE
Protein, UR: NEGATIVE mg/dL
RBC, UA: 68 /hpf — ABNORMAL HIGH (ref 0–5)
Renal Epithel, UA: <1 /hpf (ref 0–1)
Squam Epithel, UA: 3 /hpf — ABNORMAL HIGH (ref 0–2)
Trans Epithel, UA: <1 /hpf (ref 0–2)
Urine Specific Gravity: 1.006 (ref 1.001–1.040)
Urobilinogen, UA: NORMAL mg/dL
WBC, UA: 28 /hpf — ABNORMAL HIGH (ref 0–4)
pH, Urine: 7 pH (ref 5.0–8.0)

## 2022-01-22 LAB — HCG QUANTITATIVE: BHCG Quant.: 816.7 m[IU]/mL

## 2022-01-22 LAB — VH EXTRA SPECIMEN LABEL

## 2022-01-22 NOTE — ED Triage Notes (Signed)
Patient presents to the ED for vaginal bleeding. She reports that she thinks she is about [redacted] weeks pregnant based on positive home test. Her last full period was in July, she started having bleeding and period like pain.

## 2022-01-22 NOTE — ED Provider Notes (Signed)
[place]  History and Physical Exam     Patient Name: Sarah Shaffer, Sarah Shaffer  Encounter Date:  01/22/2022  Attending Physician: Herbie Baltimore L. Trellis Paganini., DO.  Room:  W46/W46-A  Patient DOB:  [birth year]  Age: 46 y.o. female  MRN:  [id number]  PCP: Pcp, None, MD      Diagnosis/Disposition:  MDM:     Final Impression  1. Threatened miscarriage in early pregnancy        Disposition  ED Disposition       ED Disposition   Discharge    Condition   --    Date/Time   Sat Jan 22, 2022  6:21 PM    Comment   [place] [facility] discharge to home/self care.    Condition at disposition: Stable                 Follow up  [facility]  [place] [address]  [phone]    or your GYN in 6 days for repeat blood work        ED Summary:  Medical Decision Making  Visiting from out of town here lives in [place].  Here with vaginal bleeding for several days.  Quantitative hCG is less than 900 so unlikely to see anything on ultrasound per ultrasound tech so we will cancel ultrasound was ordered in triage.  Patient will be discharged back to follow-up with her health department clinic in [place] I gave her specific instructions via interpreter on video line that she must follow-up in 6 days for repeat quantitative hCG and also gave her written instructions for same.    Problems Addressed:  Threatened miscarriage in early pregnancy: acute illness or injury with systemic symptoms    Amount and/or Complexity of Data Reviewed  Labs: ordered. Decision-making details documented in ED Course.    Risk  Prescription drug management.  Decision regarding hospitalization.        MDM   Amount and complexity of data contributing to medical decision making for this encounter:                         Rhythm Strip Interpretation:         Differential DX:    Miscarriage threatened miscarriage not likely ectopic    The results of diagnostic  studies have been reviewed by myself. Available past medical, family, social, and surgical histories have been reviewed by myself. The clinical impression and plan have been discussed with the patient and/or the patient's family. All questions have been answered.      History of Presenting Illness:     Chief complaint: Vaginal Bleeding-pregnant      Sarah Shaffer is a 46 y.o. female presenting to ED room 46.  Here        I am unsure why she is in this area at this time bleeding started several days ago she also has some mild back pain but no abdominal or pelvic pain.  History is all obtained by video interpreter        Review of Systems:  Physical Exam:     Review of Systems  Vitals:    01/22/22 1424 01/22/22 1835   BP: 120/56 111/70   Pulse: 76    Resp: 22    Temp: 97.9 F (36.6 C)    TempSrc: Temporal    SpO2: 100%  99%   Weight: 81.6 kg    Height: 1.626 m           Physical Exam  Vitals and nursing note reviewed.   Constitutional:       Appearance: Normal appearance.   Cardiovascular:      Rate and Rhythm: Normal rate.   Pulmonary:      Effort: Pulmonary effort is normal.   Abdominal:      General: Abdomen is flat. Bowel sounds are normal.      Palpations: Abdomen is soft.   Skin:     General: Skin is warm and dry.      Capillary Refill: Capillary refill takes less than 2 seconds.   Neurological:      Mental Status: She is alert.   Psychiatric:         Mood and Affect: Mood normal.            Allergies & Medications:     Pt has No Known Allergies.    There are no discharge medications for this patient.      Prescriptions  There are no discharge medications for this patient.      ED medications  ED Medication Orders (From admission, onward)      None                      Past History:     Medical: Pt has no past medical history on file.    Surgical: Pt  has a past surgical history that includes Cervical biopsy.    Family: The family history is not on file.    Social: Pt reports that she has never  smoked. She has never used smokeless tobacco. She reports that she does not currently use alcohol. She reports that she does not currently use drugs.        Diagnostic Results:     Radiologic Studies  No results found.    Lab Studies  Labs Reviewed   COMPREHENSIVE METABOLIC PANEL - Abnormal; Notable for the following components:       Result Value    BUN / Creatinine Ratio 9.6 (*)     All other components within normal limits   VH URINALYSIS WITH MICROSCOPIC AND CULTURE IF INDICATED       - Abnormal; Notable for the following components:    Blood, UA Large (*)     Leukocyte Esterase, UA 500 (*)     WBC, UA 28 (*)     RBC, UA 68 (*)     Bacteria, UA Occasional (*)     Squam Epithel, UA 3 (*)     All other components within normal limits    Narrative:     A Urine Culture has been ordered based upon the Positive UA results.   VH CULTURE, URINE   VH EXTRA SPECIMEN LABEL   HCG QUANTITATIVE   CBC AND DIFFERENTIAL         Procedure/EKG:     Procedures    EKG:   Last EKG Result       None                ATTESTATIONS     Marquez Ceesay L. Trellis Paganini., DO    The documentation may have been recorded by my scribe, Denese Killings, and reflects the services I personally performed and the decisions made by me.     The results of diagnostic studies have been reviewed by myself.  The above past medical, family, social, and surgical histories have been reviewed by myself. The clinical impression and plan have been discussed with the patient and/or the patient's family. All questions have been answered.    Note:  This chart was generated by an EMR and may contain errors, including typographical, or omissions not intended by the user. This chart was generated by the Epic EMR system/speech recognition and may contain inherent errors or omissions not intended by the user. Grammatical errors, random word insertions, deletions, pronoun errors and incomplete sentences are occasional consequences of this technology due to software limitations. Not all  errors are caught or corrected. If there are questions or concerns about the content of this note or information contained within the body of this dictation they should be addressed directly with the author for clarification            Etta Quill., DO  01/22/22 1842       Etta Quill., DO  01/23/22 1306

## 2022-01-22 NOTE — Discharge Instructions (Addendum)
Follow up with your GYN clinic in [place] in 6 days for quantitative HCG testing

## 2022-01-23 LAB — VH CULTURE, URINE

## 2022-01-24 ENCOUNTER — Other Ambulatory Visit: Payer: Self-pay

## 2022-01-24 ENCOUNTER — Emergency Department
Admission: EM | Admit: 2022-01-24 | Discharge: 2022-01-24 | Disposition: A | Discharge status: Home / Self Care | Payer: Self-pay | Attending: Emergency Medicine | Admitting: Emergency Medicine

## 2022-01-24 ENCOUNTER — Emergency Department: Payer: Self-pay

## 2022-01-24 DIAGNOSIS — O209 Hemorrhage in early pregnancy, unspecified: Secondary | ICD-10-CM

## 2022-01-24 DIAGNOSIS — O039 Complete or unspecified spontaneous abortion without complication: Secondary | ICD-10-CM | POA: Insufficient documentation

## 2022-01-24 DIAGNOSIS — O4690 Antepartum hemorrhage, unspecified, unspecified trimester: Secondary | ICD-10-CM

## 2022-01-24 LAB — CBC WITH DIFFERENTIAL/PLATELET
Abs Immature Granulocytes: 0.02 10*3/uL (ref 0.00–0.07)
Basophils Absolute: 0 10*3/uL (ref 0.0–0.1)
Basophils Relative: 1 %
Eosinophils Absolute: 0.1 10*3/uL (ref 0.0–0.5)
Eosinophils Relative: 1 %
HCT: 36.3 % (ref 36.0–46.0)
Hemoglobin: 12 g/dL (ref 12.0–15.0)
Immature Granulocytes: 0 %
Lymphocytes Relative: 27 %
Lymphs Abs: 1.8 10*3/uL (ref 0.7–4.0)
MCH: 29.4 pg (ref 26.0–34.0)
MCHC: 33.1 g/dL (ref 30.0–36.0)
MCV: 89 fL (ref 80.0–100.0)
Monocytes Absolute: 0.4 10*3/uL (ref 0.1–1.0)
Monocytes Relative: 5 %
Neutro Abs: 4.6 10*3/uL (ref 1.7–7.7)
Neutrophils Relative %: 66 %
Platelets: 300 10*3/uL (ref 150–400)
RBC: 4.08 MIL/uL (ref 3.87–5.11)
RDW: 12.4 % (ref 11.5–15.5)
WBC: 6.9 10*3/uL (ref 4.0–10.5)
nRBC: 0 % (ref 0.0–0.2)

## 2022-01-24 LAB — BASIC METABOLIC PANEL
Anion gap: 7 (ref 5–15)
BUN: 11 mg/dL (ref 6–20)
CO2: 25 mmol/L (ref 22–32)
Calcium: 9.1 mg/dL (ref 8.9–10.3)
Chloride: 108 mmol/L (ref 98–111)
Creatinine, Ser: 0.68 mg/dL (ref 0.44–1.00)
GFR, Estimated: >60 mL/min (ref >60)
Glucose, Bld: 84 mg/dL (ref 70–99)
Potassium: 3.7 mmol/L (ref 3.5–5.1)
Sodium: 140 mmol/L (ref 135–145)

## 2022-01-24 LAB — CHLAMYDIA/NGC RT PCR (ARMC ONLY)
Chlamydia Tr: NOT DETECTED
N gonorrhoeae: NOT DETECTED

## 2022-01-24 LAB — HCG, QUANTITATIVE, PREGNANCY: hCG, Beta Chain, Quant, S: 526 m[IU]/mL — ABNORMAL HIGH (ref <5)

## 2022-01-24 LAB — POC URINE PREG, ED: Preg Test, Ur: POSITIVE — AB

## 2022-01-24 LAB — WET PREP, GENITAL
Clue Cells Wet Prep HPF POC: NONE SEEN
Sperm: NONE SEEN
Trich, Wet Prep: NONE SEEN
WBC, Wet Prep HPF POC: <10 (ref <10)
Yeast Wet Prep HPF POC: NONE SEEN

## 2022-01-24 LAB — ABO/RH: ABO/RH(D): B POS

## 2022-01-24 NOTE — ED Triage Notes (Signed)
Pt arrive with c/o vaginal bleeding that started 2 days ago. Per pt, she is [redacted] weeks pregnant. Pt is changing pads frequently and clots are present.

## 2022-01-24 NOTE — ED Notes (Signed)
See triage note  Presents with 

## 2022-01-24 NOTE — ED Provider Notes (Signed)
[facility] Provider Note    Event Date/Time   First MD Initiated Contact with Patient 01/24/22 1053     (approximate)   History   Chief Complaint Vaginal Bleeding   HPI  Phala Eleri Ruben is a 46 y.o. female, [id number] at approximately 7 weeks of pregnancy who presents to the ED complaining of vaginal bleeding.  History is limited as patient is Spanish-speaking only, history obtained via video interpreter [phone].  Patient reports that for the past 3 days she has been dealing with persistent vaginal bleeding as well as crampy pain in her pelvic area and lower back.  Pain seems to wax and wane in severity, has not been exacerbated or alleviated by anything in particular.  She reports passing small clots as well as "seedy" discharge.  She reports a positive pregnancy test at home, has not yet had an ultrasound or been seen by OB/GYN.     Physical Exam   Triage Vital Signs: ED Triage Vitals  Enc Vitals Group     BP 01/24/22 1021 128/88     Pulse Rate 01/24/22 1021 77     Resp 01/24/22 1021 15     Temp 01/24/22 1021 98.9 F (37.2 C)     Temp src --      SpO2 01/24/22 1021 99 %     Weight 01/24/22 1049 209 lb 3.5 oz (94.9 kg)     Height 01/24/22 1049 5\' 5"  (1.651 m)     Head Circumference --      Peak Flow --      Pain Score 01/24/22 1022 0     Pain Loc --      Pain Edu? --      Excl. in GC? --     Most recent vital signs: Vitals:   01/24/22 1021  BP: 128/88  Pulse: 77  Resp: 15  Temp: 98.9 F (37.2 C)  SpO2: 99%    Constitutional: Alert and oriented. Eyes: Conjunctivae are normal. Head: Atraumatic. Nose: No congestion/rhinnorhea. Mouth/Throat: Mucous membranes are moist.  Cardiovascular: Normal rate, regular rhythm. Grossly normal heart sounds.  2+ radial pulses bilaterally. Respiratory: Normal respiratory effort.  No retractions. Lungs CTAB. Gastrointestinal: Soft and nontender. No distention. Genitourinary: Pelvic exam  with small amount of blood, cervical os appears open. Musculoskeletal: No lower extremity tenderness nor edema.  Neurologic:  Normal speech and language. No gross focal neurologic deficits are appreciated.    ED Results / Procedures / Treatments   Labs (all labs ordered are listed, but only abnormal results are displayed) Labs Reviewed  HCG, QUANTITATIVE, PREGNANCY - Abnormal; Notable for the following components:      Result Value   hCG, Beta Chain, Quant, S 526 (*)    All other components within normal limits  POC URINE PREG, ED - Abnormal; Notable for the following components:   Preg Test, Ur POSITIVE (*)    All other components within normal limits  WET PREP, GENITAL  CHLAMYDIA/NGC RT PCR ([facility] ONLY)            CBC WITH DIFFERENTIAL/PLATELET  BASIC METABOLIC PANEL  ABO/RH   RADIOLOGY Obstetric ultrasound reviewed and interpreted by me with no intrauterine pregnancy noted.  PROCEDURES:  Critical Care performed: No  Procedures   MEDICATIONS ORDERED IN ED: Medications - No data to display   IMPRESSION / MDM / ASSESSMENT AND PLAN / ED COURSE  I reviewed the triage vital signs and the nursing notes.  46 y.o. female [phone] at approximately 7 weeks of pregnancy who presents to the ED with 3 days of vaginal bleeding, discharge, and crampy pain in her lower abdomen and back.  Patient's presentation is most consistent with acute presentation with potential threat to life or bodily function.  Differential diagnosis includes, but is not limited to, threatened miscarriage, completed miscarriage, incomplete miscarriage, ectopic pregnancy, anemia.  Patient nontoxic-appearing and in no acute distress, vital signs are unremarkable.  She has a benign abdominal exam, pelvic exam with small amount of blood however cervical os appears open.  Labs are reassuring with no significant anemia, leukocytosis, electrolyte abnormality, or AKI.  Pregnancy testing is  positive, however hCG levels are lower than we would expect for this stage of pregnancy.  Presentation concerning for miscarriage and we will further assess with ultrasound.  Ultrasound does not show any intrauterine pregnancy.  Findings consistent with pregnancy of unknown anatomic location, however I am concerned for completed miscarriage given patient's symptoms with low hCG level.  She additionally shows me tissue that she has passed since she has been here in the ED that appears consistent with miscarriage.  Bleeding remains relatively mild at this time and she is appropriate for discharge home with follow-up in 1 week for repeat hCG levels and ultrasound.  She was counseled to call OB/GYN to arrange for this and to return to the ED if she has any worsening symptoms.  Patient agrees with plan.      FINAL CLINICAL IMPRESSION(S) / ED DIAGNOSES   Final diagnoses:  Vaginal bleeding in pregnancy  Miscarriage     Rx / DC Orders   ED Discharge Orders     None        Note:  This document was prepared using Dragon voice recognition software and may include unintentional dictation errors.   Chesley Noon, MD 01/24/22 1308

## 2022-01-24 NOTE — ED Notes (Signed)
See triage note  Presents with some vaginal spotting which started on Friday   States bleeding became heavier today with some cramping   States she is about [redacted] weeks pregnant

## 2022-01-27 ENCOUNTER — Telehealth: Payer: Self-pay

## 2022-01-27 NOTE — Telephone Encounter (Signed)
This pt needs to be seen by [place] she went to [facility] they stated that she needs to come here. The pt states that she saw Melissa with her miscarriage how do you want me to handle it

## 2022-02-01 NOTE — Telephone Encounter (Signed)
Left HIPPA compliant voicemail to let patient know to call us back,If patient calls back please verify this apt day and time works for her

## 2022-02-08 ENCOUNTER — Encounter: Payer: Self-pay | Admitting: Obstetrics

## 2023-08-17 ENCOUNTER — Other Ambulatory Visit: Payer: Self-pay

## 2023-08-17 DIAGNOSIS — N6489 Other specified disorders of breast: Secondary | ICD-10-CM

## 2023-09-04 ENCOUNTER — Ambulatory Visit
Admission: RE | Admit: 2023-09-04 | Discharge: 2023-09-04 | Disposition: A | Discharge status: Home / Self Care | Payer: Self-pay | Source: Ambulatory Visit | Attending: Obstetrics and Gynecology | Admitting: Obstetrics and Gynecology

## 2023-09-04 ENCOUNTER — Ambulatory Visit: Payer: Self-pay | Attending: Hematology and Oncology | Admitting: *Deleted

## 2023-09-04 VITALS — BP 102/74 | Wt 188.1 lb

## 2023-09-04 DIAGNOSIS — N6489 Other specified disorders of breast: Secondary | ICD-10-CM

## 2023-09-04 DIAGNOSIS — Z1239 Encounter for other screening for malignant neoplasm of breast: Secondary | ICD-10-CM

## 2023-09-04 NOTE — Progress Notes (Signed)
 [name] is a 48 y.o. female who presents to [facility] clinic today with no complaints. Patients last bilateral diagnostic mammogram was completed 11/21/2018 that was probably benign that a 6 month follow up was recommended that was not completed.   Pap Smear: Pap smear not completed today. Last Pap smear was 11/14/2018 at [facility] clinic and was normal with negative HPV. Per patient has history of an abnormal Pap smear over 6 years ago that a biopsy and cryotherapy was completed for follow up. Per patient her last Pap smear is the only Pap smear she has had since the abnormal. Last Pap smear result is available in Epic.   Physical exam: Breasts Left breast slightly larger than right that per patient is normal for her. No skin abnormalities bilateral breasts. No nipple retraction bilateral breasts. No nipple discharge bilateral breasts. No lymphadenopathy. No lumps palpated bilateral breasts. No complaints of pain or tenderness on exam.     MS DIGITAL DIAG TOMO BILAT Result Date: 11/21/2018 CLINICAL DATA:  Left breast upper outer quadrant area of pain felt by the patient for several months. EXAM: DIGITAL DIAGNOSTIC BILATERAL MAMMOGRAM WITH CAD AND TOMO ULTRASOUND LEFT BREAST COMPARISON:  None available. ACR Breast Density Category c: The breast tissue is heterogeneously dense, which may obscure small masses. FINDINGS: Mammographically, there are no suspicious masses, areas of architectural distortion or microcalcifications in the right breast. In the left breast, slightly lower inner quadrant, posterior depth, there is a bilobed focal asymmetry containing a single coarse benign-appearing calcification. Mammographic images were processed with CAD. On physical exam, no suspicious masses are palpated. Targeted left breast ultrasound is performed, showing no suspicious masses or shadowing lesions in the area of pain in patient's left breast upper outer quadrant. No sonographic  correlation is found to the mammographically seen bilobed focal asymmetry in the left breast slightly lower inner quadrant. IMPRESSION: No mammographic evidence of malignancy in the right breast. Probably benign left breast focal asymmetry, for which short-term follow-up is recommended. RECOMMENDATION: Diagnostic mammogram and possibly ultrasound of the left breast in 6 months. (Code:DM-L-40M) I have discussed the findings and recommendations with the patient. Results were also provided in writing at the conclusion of the visit. If applicable, a reminder letter will be sent to the patient regarding the next appointment. BI-RADS CATEGORY  3: Probably benign. Electronically Signed   By: Dobrinka  Dimitrova M.D.   On: 11/21/2018 12:06    Pelvic/Bimanual Pap is due today but patient is currently on her menstrual period. Patient will call to re-schedule Pap smear.   Smoking History: Patient has never smoked.   Patient Navigation: Patient education provided. Access to services provided for patient through [employer] program. Spanish interpreter Morey Ar from [facility] provided.   Colorectal Cancer Screening: Per patient has never had colonoscopy completed. Patient completed a Cologuard March 2025 that was negative. No complaints today.    Breast and Cervical Cancer Risk Assessment: Patient does not have family history of breast cancer, known genetic mutations, or radiation treatment to the chest before age 28. Per patient has history of cervical dysplasia. Patient has no history of being immunocompromised or DES exposure in-utero.  Risk Scores as of Encounter on 09/04/2023     Gregary Lean           5-year 0.61%   Lifetime 6.91%   This patient is Hisp[place]/Latina but has no documented birth country, so the [place] model used data from [place] patients to calculate their risk score. Document  a birth country in the Demographics activity for a more accurate score.         Last calculated by Algie Ingle, LPN on 09/30/6158 at  1:01 PM       A: [facility] exam without pap smear No complaints.  P: Referred patient to the [facility] for a diagnostic mammogram per recommendation. Appointment scheduled Monday, September 04, 2023 at 1340.  Stefan Edge, RN 09/04/2023 1:08 PM

## 2023-09-04 NOTE — Patient Instructions (Signed)
 Explained breast self awareness with Ana Ali. Pap is due today but patient is currently on her menstrual period. Patient will call to re-schedule Pap smear. Referred patient to the [facility] for a diagnostic mammogram per recommendation. Appointment scheduled Monday, September 04, 2023 at 1340. Patient aware of appointment and will be there. Ana Ali verbalized understanding.  [place] Cloe, Dela Favor, RN 1:08 PM

## 2023-09-11 ENCOUNTER — Ambulatory Visit: Payer: Self-pay

## 2023-11-20 ENCOUNTER — Ambulatory Visit: Payer: Self-pay

## 2023-12-04 ENCOUNTER — Encounter: Payer: Self-pay | Admitting: Family Medicine

## 2023-12-19 ENCOUNTER — Ambulatory Visit: Payer: Self-pay
